# Patient Record
Sex: Male | Born: 1985 | Race: White | Hispanic: No | Marital: Married | Smoking: Former smoker
Health system: Southern US, Community
[De-identification: ages and names within clinical notes are randomized; demographics above are authoritative.]

## PROBLEM LIST (undated history)

## (undated) DIAGNOSIS — F1010 Alcohol abuse, uncomplicated: Secondary | ICD-10-CM

## (undated) DIAGNOSIS — F419 Anxiety disorder, unspecified: Secondary | ICD-10-CM

## (undated) HISTORY — DX: Anxiety disorder, unspecified: F41.9

## (undated) HISTORY — DX: Alcohol abuse, uncomplicated: F10.10

---
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
## 2005-02-13 ENCOUNTER — Emergency Department (HOSPITAL_COMMUNITY): Admission: EM | Admit: 2005-02-13 | Discharge: 2005-02-14 | Payer: Self-pay | Admitting: Emergency Medicine

## 2011-01-27 HISTORY — PX: ANTERIOR CRUCIATE LIGAMENT REPAIR: SHX115

## 2011-06-13 ENCOUNTER — Other Ambulatory Visit (HOSPITAL_COMMUNITY): Payer: Self-pay | Admitting: Orthopedic Surgery

## 2011-06-13 ENCOUNTER — Ambulatory Visit (HOSPITAL_COMMUNITY)
Admission: RE | Admit: 2011-06-13 | Discharge: 2011-06-13 | Disposition: A | Discharge status: Home / Self Care | Payer: BC Managed Care – PPO | Source: Ambulatory Visit | Attending: Orthopedic Surgery | Admitting: Orthopedic Surgery

## 2011-06-13 DIAGNOSIS — Z77018 Contact with and (suspected) exposure to other hazardous metals: Secondary | ICD-10-CM

## 2011-06-13 DIAGNOSIS — Z0389 Encounter for observation for other suspected diseases and conditions ruled out: Secondary | ICD-10-CM | POA: Insufficient documentation

## 2012-12-17 ENCOUNTER — Ambulatory Visit (INDEPENDENT_AMBULATORY_CARE_PROVIDER_SITE_OTHER): Payer: BC Managed Care – PPO | Admitting: Family Medicine

## 2012-12-17 VITALS — BP 140/80 | HR 54 | Temp 97.6°F | Resp 16 | Ht 74.0 in | Wt 186.0 lb

## 2012-12-17 DIAGNOSIS — K5289 Other specified noninfective gastroenteritis and colitis: Secondary | ICD-10-CM

## 2012-12-17 DIAGNOSIS — E860 Dehydration: Secondary | ICD-10-CM

## 2012-12-17 DIAGNOSIS — K529 Noninfective gastroenteritis and colitis, unspecified: Secondary | ICD-10-CM

## 2012-12-17 MED ORDER — DICYCLOMINE HCL 10 MG PO CAPS: 10.0000 mg | ORAL_CAPSULE | Freq: Three times a day (TID) | ORAL | Status: DC

## 2012-12-17 MED ORDER — ONDANSETRON 4 MG PO TBDP: 4.0000 mg | ORAL_TABLET | Freq: Three times a day (TID) | ORAL | Status: DC | PRN

## 2012-12-17 NOTE — Progress Notes (Signed)
Subjective: Since Wednesday the patient been having gastroenteritis symptoms. He started with diarrhea and vomiting. He got himself dry initially, and was lightheaded and fell against a door frame splitting his region of his nose. That is doing better. The vomiting subsided and he has continued having profuse watery diarrhea. He has been able to drink enough to stay ahead fairly well. He is off the new years.  Objective:  He has throat is clear. Neck supple without nodes. Chest clear. Heart regular without murmurs. Abdomen had active bowel sounds. Soft, nontender. He denies any blood in his stool.  Assessment: Acute gastroenteritis with mild dehydration and small laceration on bridge of nose  Plan:  Per his discharge instructions. Mental Zofran Imodium Fluids Return when necessary

## 2012-12-17 NOTE — Patient Instructions (Signed)
Drink lots of fluids  Eat bland foods  Try to get sufficient rest  Take the Bentyl before meals and at bedtime for a day or 2 or until symptoms are really doing well  Use the Zofran only if needed for vomiting  Get over-the-counter Imodium take as directed for diarrhea

## 2016-02-01 ENCOUNTER — Encounter: Payer: Self-pay | Admitting: Family Medicine

## 2016-02-01 ENCOUNTER — Ambulatory Visit (INDEPENDENT_AMBULATORY_CARE_PROVIDER_SITE_OTHER): Payer: Worker's Compensation | Admitting: Family Medicine

## 2016-02-01 VITALS — BP 134/90 | HR 87 | Temp 97.9°F | Ht 74.0 in | Wt 199.6 lb

## 2016-02-01 DIAGNOSIS — T1591XA Foreign body on external eye, part unspecified, right eye, initial encounter: Secondary | ICD-10-CM | POA: Diagnosis not present

## 2016-02-01 MED ORDER — CYCLOSPORINE 0.05 % OP EMUL: 1.0000 [drp] | Freq: Two times a day (BID) | OPHTHALMIC | Status: DC

## 2016-02-01 NOTE — Progress Notes (Signed)
   Subjective:  Patient ID: Jacob Herring, male    DOB: 1986-01-10  Age: 30 y.o. MRN: [id number]  CC: Eye Injury   HPI JOAL EAKLE presents for noted redness in the right eye last night. He thought he had some metal in it. He is a Psychologist, occupational. He has some irritation in the eye today. He states that last night when he looked in the mirror before bed he noted the eye was somewhat red and he started irrigating it. He states he irrigated it for about 30 minutes. At some point during that time he tried to get out what he saw in his eye as a black spot and had a black flat, out on a Q-tip. However the black spot remaining so he continue trying to work on it. This morning he states that his vision is unaffected. However it remains red in the right conjunctiva. There is some irritation only. The left has been unaffected. He says that he has some decreased vision on the right historically compared to the left.    ROS Review of Systems  Constitutional: Negative for fever, chills and diaphoresis.  HENT: Negative for rhinorrhea and sore throat.   Eyes: Positive for redness and itching. Negative for photophobia, discharge and visual disturbance.  Respiratory: Negative for cough and shortness of breath.   Cardiovascular: Negative for chest pain.  Gastrointestinal: Negative for abdominal pain.  Musculoskeletal: Negative for myalgias and arthralgias.  Skin: Negative for rash.  Neurological: Negative for weakness and headaches.    Objective:  BP 134/90 mmHg  Pulse 87  Temp(Src) 97.9 F (36.6 C) (Oral)  Ht  (1.88 m)  Wt 199 lb 9.6 oz (90.538 kg)  BMI 25.62 kg/m2  SpO2 99%  Physical Exam  Constitutional: He is oriented to person, place, and time. He appears well-developed and well-nourished.  HENT:  Head: Normocephalic and atraumatic.  Nose: Nose normal.  Mouth/Throat: No oropharyngeal exudate.  Eyes: EOM and lids are normal. Lids are everted and swept, no foreign bodies found. Right eye  exhibits no chemosis, no discharge, no exudate and no hordeolum. No foreign body present in the right eye. Right conjunctiva is injected. Right conjunctiva has no hemorrhage. No scleral icterus. Right eye exhibits no nystagmus. Right pupil is not reactive. Pupils are equal.  Fundoscopic exam:      The right eye shows no hemorrhage and no papilledema. The right eye shows red reflex. The right eye shows no venous pulsations.  Neck: Normal range of motion. Neck supple.  Neurological: He is alert and oriented to person, place, and time. No cranial nerve deficit.  Skin: Skin is warm and dry.  Psychiatric: He has a normal mood and affect. His behavior is normal. Thought content normal.    Assessment & Plan:   Osmar was seen today for eye injury.  Diagnoses and all orders for this visit:  Foreign body, eye, right, initial encounter  Other orders -     cycloSPORINE (RESTASIS) 0.05 % ophthalmic emulsion; Place 1 drop into the right eye 2 (two) times daily. To moisturize    Return to regular duty today.   Follow-up: Return if symptoms worsen or fail to improve.  Mechele Claude, M.D.

## 2016-02-04 ENCOUNTER — Other Ambulatory Visit: Payer: Self-pay | Admitting: *Deleted

## 2016-02-04 MED ORDER — CYCLOSPORINE 0.05 % OP EMUL: 1.0000 [drp] | Freq: Two times a day (BID) | OPHTHALMIC | Status: DC

## 2016-02-04 NOTE — Progress Notes (Signed)
spoke with Neysa Bonito at [employer] Pt needed Rx for eye drops sent into [facility] sent into pharmacy per pt request

## 2016-02-05 ENCOUNTER — Telehealth: Payer: Self-pay

## 2016-02-05 NOTE — Telephone Encounter (Signed)
Patients company paid for restasis.

## 2016-02-05 NOTE — Telephone Encounter (Signed)
Tell pt. To use OTC Systane.

## 2016-02-05 NOTE — Telephone Encounter (Signed)
Insurance denied Restasis   Must have one of the following Keratoconjunctivitis sicca or Sjogren syndrome   Must have intolerance to one over the counter med used at an optimal dose for 2 weeks

## 2016-04-28 ENCOUNTER — Encounter: Payer: Self-pay | Admitting: Nurse Practitioner

## 2016-04-28 ENCOUNTER — Ambulatory Visit (INDEPENDENT_AMBULATORY_CARE_PROVIDER_SITE_OTHER): Payer: Worker's Compensation | Admitting: Nurse Practitioner

## 2016-04-28 ENCOUNTER — Ambulatory Visit (INDEPENDENT_AMBULATORY_CARE_PROVIDER_SITE_OTHER): Payer: Worker's Compensation

## 2016-04-28 VITALS — BP 125/77 | HR 49 | Temp 97.3°F | Ht 74.0 in | Wt 200.0 lb

## 2016-04-28 DIAGNOSIS — S92322A Displaced fracture of second metatarsal bone, left foot, initial encounter for closed fracture: Secondary | ICD-10-CM

## 2016-04-28 DIAGNOSIS — S99921A Unspecified injury of right foot, initial encounter: Secondary | ICD-10-CM | POA: Diagnosis not present

## 2016-04-28 NOTE — Progress Notes (Signed)
   Subjective:    Patient ID: Jacob Herring, male    DOB: 1986-07-13, 30 y.o.   MRN: [id number]  HPI  DATE OF INJURY 04/07/16   Emp- [employer]  Patient was working on a water tank and a lid fell on his right foot. Painful every since- stays swollen- pain has improved some but it is still hurting.  Review of Systems  Constitutional: Negative.   HENT: Negative.   Respiratory: Negative.   Cardiovascular: Negative.   Genitourinary: Negative.   Neurological: Negative.   Psychiatric/Behavioral: Negative.   All other systems reviewed and are negative.      Objective:   Physical Exam  Constitutional: He is oriented to person, place, and time. He appears well-developed and well-nourished. No distress.  Cardiovascular: Normal rate, regular rhythm and normal heart sounds.   Pulmonary/Chest: Effort normal and breath sounds normal.  Musculoskeletal:  Pian on dorsal surface of right foot along 2nd metatasal. NO edema or pain with movement  Neurological: He is alert and oriented to person, place, and time.  Skin: Skin is warm.  Psychiatric: He has a normal mood and affect. His behavior is normal. Judgment and thought content normal.   BP 125/77 mmHg  Pulse 49  Temp(Src) 97.3 F (36.3 C) (Oral)  Ht 6\' 2"  (1.88 m)  Wt 200 lb (90.719 kg)  BMI 25.67 kg/m2  Right foot xray- displace fracture of 2nd metatarsal-Preliminary reading by Paulene FloorMary Jema Deegan, FNP  [facility]        Assessment & Plan:   1. Foot injury, right, initial encounter   2. Displaced fracture of second metatarsal bone of left foot, closed, initial encounter    Orders Placed This Encounter  Procedures  . DG Foot Complete Right    Standing Status: Future     Number of Occurrences: 1     Standing Expiration Date: 06/28/2017    Order Specific Question:  Reason for Exam (SYMPTOM  OR DIAGNOSIS REQUIRED)    Answer:  foot injury    Order Specific Question:  Preferred imaging location?    Answer:  Internal  . Ambulatory referral  to Orthopedic Surgery    Referral Priority:  Urgent    Referral Type:  Surgical    Referral Reason:  Specialty Services Required    Requested Specialty:  Orthopedic Surgery    Number of Visits Requested:  1   Wll let ortho decide on care  Mary-Margaret Daphine DeutscherMartin, FNP

## 2016-12-19 ENCOUNTER — Encounter: Payer: Self-pay | Admitting: Family Medicine

## 2016-12-19 ENCOUNTER — Ambulatory Visit (INDEPENDENT_AMBULATORY_CARE_PROVIDER_SITE_OTHER): Payer: 59 | Admitting: Family Medicine

## 2016-12-19 VITALS — BP 136/72 | HR 74 | Temp 98.2°F | Ht 75.0 in | Wt 197.2 lb

## 2016-12-19 DIAGNOSIS — F1010 Alcohol abuse, uncomplicated: Secondary | ICD-10-CM

## 2016-12-19 DIAGNOSIS — Z87898 Personal history of other specified conditions: Secondary | ICD-10-CM | POA: Insufficient documentation

## 2016-12-19 DIAGNOSIS — N50811 Right testicular pain: Secondary | ICD-10-CM

## 2016-12-19 NOTE — Progress Notes (Signed)
Phone: [phone]  Subjective:  Patient presents today to establish care. Chief complaint-noted.   See problem oriented charting  The following were reviewed and entered/updated in epic: Past Medical History:  Diagnosis Date  . Alcohol abuse    Patient Active Problem List   Diagnosis Date Noted  . Alcohol abuse    Past Surgical History:  Procedure Laterality Date  . ANTERIOR CRUCIATE LIGAMENT REPAIR Right 01/27/2011    Family History  Problem Relation Age of Onset  . Alcohol abuse Mother   . Drug abuse Mother   . Lung cancer Maternal Grandfather   . Other Father     chronic back pain  . Obesity Father   . Sudden death Cousin     before age 30 PNA leading to sepsis in cousin- tough for patient    Medications- reviewed and updated No current outpatient prescriptions on file.   No current facility-administered medications for this visit.     Allergies-reviewed and updated Allergies  Allergen Reactions  . Penicillins Anaphylaxis    Social History   Social History  . Marital status: Married    Spouse name: N/A  . Number of children: N/A  . Years of education: N/A   Social History Main Topics  . Smoking status: Current Every Day Smoker    Packs/day: 0.50    Years: 6.00    Types: Cigarettes  . Smokeless tobacco: Current User  . Alcohol use Yes  . Drug use: No  . Sexual activity: Yes    Birth control/ protection: None   Other Topics Concern  . None   Social History Narrative   Married. Wife Ayla patient of Dr. Durene CalHunter   [place] and owns home as of 2017      Works as Psychologist, [employer]. HS degree    ROS--Full ROS was completed Review of Systems  Constitutional: Negative for chills and fever.  HENT: Negative for hearing loss and tinnitus.   Eyes: Negative for blurred vision and double vision.  Respiratory: Negative for cough and hemoptysis.   Cardiovascular: Negative for chest pain and palpitations.  Gastrointestinal: Negative for heartburn  and nausea.  Genitourinary: Negative for dysuria, hematuria and urgency.  Musculoskeletal: Negative for myalgias and neck pain.  Skin: Negative for itching and rash.  Neurological: Negative for dizziness.  Endo/Heme/Allergies: Does not bruise/bleed easily.  Psychiatric/Behavioral: Positive for substance abuse (2 12 packs on weekend- advised cessation). Negative for suicidal ideas. The patient is nervous/anxious.    Objective: BP 136/72   Pulse 74   Temp 98.2 F (36.8 C) (Oral)   Ht 6\' 3"  (1.905 m)   Wt 197 lb 3.2 oz (89.4 kg)   SpO2 97%   BMI 24.65 kg/m  Gen: NAD, resting comfortably HEENT: Mucous membranes are moist. Oropharynx normal. TM normal. Eyes: sclera and lids normal, PERRLA Neck: no thyromegaly, no cervical lymphadenopathy CV: RRR no murmurs rubs or gallops Lungs: CTAB no crackles, wheeze, rhonchi Abdomen: soft/nontender/nondistended/normal bowel sounds. No rebound or guarding.  Ext: no edema Skin: warm, dry Neuro: 5/5 strength in upper and lower extremities, normal gait, normal reflexes  GU: normal penis, no hernia noted,2-3 cm bag of worms sensation right side of scrotum, none on left. Both testicles normal size and nontender including epidymis  Assessment/Plan:  Right testicular pain - Plan: US Scrotum S: Right testicular discomfort. Started about 2 years ago when using an ab roller and improved when he stopped. Seen at [employer] physical and thought related to potential strain. At age 30  did have hernia surgery. He is really active doing ab roller, climbing water towers. 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 Does testicular self exams and has not felt bumps or lumps. No pain with palpation. No family history of testicular cancer. States does not think they will plan on having children. No dysuria or polyuria. No penile discharge or rectal pain. Discomfort moderate ache A/P: will arrange for testicular ultrasound at this point though suspect varicocele as cause  (doubt cancer). Counseling on varicocele provided- he states fertility not important for him as does not think wants to get pregnant and discomfort is not super significant- just wanted to make sure nothing more sever egoing on.  Alcohol abuse- advised cessation  Return in about 6 months (around 06/19/2017) for physical.   Orders Placed This Encounter  Procedures  . US Scrotum    Standing Status:   Future    Standing Expiration Date:   02/19/2018    Order Specific Question:   Reason for Exam (SYMPTOM  OR DIAGNOSIS REQUIRED)    Answer:   right testicular pain/fullness- suspect varicocele    Order Specific Question:   Preferred imaging location?    Answer:   [id number]    Return precautions advised.  Tana ConchStephen Leeum Sankey, MD

## 2016-12-19 NOTE — Patient Instructions (Signed)
Advise you to quit smoking completely- great job on cutting back  Advise you to consider AA given your frequent alcohol  We will call you within a week about your referral for testicular ultrasound. If you do not hear within 2 weeks, give us a call.   I suspect this is varicocele though   Varicocele A varicocele is a swelling of veins in the scrotum. The scrotum is the sac that contains the testicles. Varicoceles can occur on either side of the scrotum, but they are more common on the left side. They occur most often in teenage boys and young men. In most cases, varicoceles are not a serious problem. They are usually small and painless and do not require treatment. Tests may be done to confirm the diagnosis. Treatment may be needed if:  A varicocele is large, causes a lot of pain, or causes pain when exercising.  Varicoceles are found on both sides of the scrotum.  The testicle on the opposite side is absent or not normal.  A varicocele causes a decrease in the size of the testicle in a growing adolescent.  The person has fertility problems. What are the causes? This condition is the result of valves in the veins not working properly. Valves in the veins help to return blood from the scrotum and testicles to the heart. If these valves do not work well, blood flows backward and backs up into the veins, which causes the veins to swell. This is similar to what happens when varicose veins form in the leg. What are the signs or symptoms? Most varicoceles do not cause any symptoms. If symptoms do occur, they may include:  Swelling on one side of the scrotum. The swelling may be more obvious when you are standing up.  A lumpy feeling in the scrotum.  A heavy feeling on one side of the scrotum.  A dull ache in the scrotum, especially after exercise or prolonged standing or sitting.  Slower growth or reduced size of the testicle on the side of the varicocele (in young males).  Problems  with fertility. These can occur if the testicle does not grow normally. How is this diagnosed? This condition may be diagnosed with a physical exam. You may also have an imaging test, called an ultrasound, to confirm the diagnosis and to help rule out other causes of the swelling. How is this treated? Treatment is usually not needed for this condition. If you have any pain, your health care provider may prescribe or recommend medicine to help relieve it. You may need regular exams so your health care provider can monitor the varicocele to ensure that it does not cause problems. When further treatment is needed, it may involve one of these options:  Varicocelectomy. This is a surgery in which the swollen veins are tied off so that the flow of blood goes to other veins instead.  Embolization. In this procedure, a small tube (catheter) is used to place metal coils or other blocking items in the veins. This cuts off the blood flow to the swollen veins. Follow these instructions at home:  Take medicines only as directed by your health care provider.  Wear supportive underwear.  Use an athletic supporter for sports.  Keep all follow-up visits as directed by your health care provider. This is important. Contact a health care provider if:  Your pain is increasing.  You have redness in the affected area.  You have swelling that does not decrease when you are lying  down.  One of your testicles is smaller than the other.  Your testicle becomes enlarged, swollen, or painful. This information is not intended to replace advice given to you by your health care provider. Make sure you discuss any questions you have with your health care provider. Document Released: 03/23/2001 Document Revised: 05/28/2016 Document Reviewed: 11/22/2014 Elsevier Interactive Patient Education  2017 [employer]

## 2016-12-19 NOTE — Progress Notes (Signed)
Pre visit review using our clinic review tool, if applicable. No additional management support is needed unless otherwise documented below in the visit note. 

## 2016-12-23 ENCOUNTER — Other Ambulatory Visit: Payer: Self-pay | Admitting: Family Medicine

## 2016-12-23 ENCOUNTER — Telehealth: Payer: Self-pay | Admitting: Family Medicine

## 2016-12-23 NOTE — Telephone Encounter (Addendum)
Jacob MessierKathy with [facility] needs another order added to the one in the system dopplar code 2191  She is aware dr hunter is out until next week.

## 2016-12-24 ENCOUNTER — Other Ambulatory Visit: Payer: Self-pay

## 2016-12-24 DIAGNOSIS — N50811 Right testicular pain: Secondary | ICD-10-CM

## 2016-12-24 NOTE — Telephone Encounter (Signed)
Order entered as requested

## 2017-01-02 ENCOUNTER — Ambulatory Visit
Admission: RE | Admit: 2017-01-02 | Discharge: 2017-01-02 | Disposition: A | Discharge status: Home / Self Care | Payer: 59 | Source: Ambulatory Visit | Attending: Family Medicine | Admitting: Family Medicine

## 2017-01-02 DIAGNOSIS — N50811 Right testicular pain: Secondary | ICD-10-CM

## 2017-01-06 ENCOUNTER — Other Ambulatory Visit: Payer: Self-pay | Admitting: Family Medicine

## 2017-01-06 DIAGNOSIS — N50811 Right testicular pain: Secondary | ICD-10-CM

## 2017-01-12 ENCOUNTER — Other Ambulatory Visit: Payer: 59

## 2017-01-12 ENCOUNTER — Other Ambulatory Visit (HOSPITAL_COMMUNITY)
Admission: RE | Admit: 2017-01-12 | Discharge: 2017-01-12 | Disposition: A | Discharge status: Home / Self Care | Payer: 59 | Source: Ambulatory Visit | Attending: Family Medicine | Admitting: Family Medicine

## 2017-01-12 DIAGNOSIS — N50811 Right testicular pain: Secondary | ICD-10-CM

## 2017-01-12 DIAGNOSIS — Z113 Encounter for screening for infections with a predominantly sexual mode of transmission: Secondary | ICD-10-CM | POA: Diagnosis not present

## 2017-01-13 LAB — URINE CYTOLOGY ANCILLARY ONLY
Chlamydia: NEGATIVE
Neisseria Gonorrhea: NEGATIVE
TRICH (WINDOWPATH): NEGATIVE

## 2017-01-19 ENCOUNTER — Ambulatory Visit: Payer: 59 | Admitting: Family Medicine

## 2017-01-26 ENCOUNTER — Encounter: Payer: Self-pay | Admitting: Family Medicine

## 2017-01-26 ENCOUNTER — Ambulatory Visit (INDEPENDENT_AMBULATORY_CARE_PROVIDER_SITE_OTHER): Payer: 59 | Admitting: Family Medicine

## 2017-01-26 VITALS — BP 128/78 | HR 69 | Temp 98.6°F | Ht 75.0 in | Wt 205.2 lb

## 2017-01-26 DIAGNOSIS — Z23 Encounter for immunization: Secondary | ICD-10-CM

## 2017-01-26 DIAGNOSIS — N451 Epididymitis: Secondary | ICD-10-CM | POA: Diagnosis not present

## 2017-01-26 DIAGNOSIS — Z8371 Family history of colonic polyps: Secondary | ICD-10-CM | POA: Insufficient documentation

## 2017-01-26 MED ORDER — LEVOFLOXACIN 500 MG PO TABS: 500.0000 mg | ORAL_TABLET | Freq: Every day | ORAL | 0 refills | Status: DC

## 2017-01-26 NOTE — Progress Notes (Signed)
Pre visit review using our clinic review tool, if applicable. No additional management support is needed unless otherwise documented below in the visit note. 

## 2017-01-26 NOTE — Patient Instructions (Signed)
On ultrasound looked like a small infection on top of testicle. Let's see if this antibiotic can clear it up. If not, could refer to urology for their opinion. I am concerned this could be muscle strain related as well though given worse after exercise. Could consider our sports medicine doctors if urology unable to help   Epididymitis Introduction Epididymitis is swelling (inflammation) of the epididymis. The epididymis is a cord-like structure that is located along the top and back part of the testicle. It collects and stores sperm from the testicle. This condition can also cause pain and swelling of the testicle and scrotum. Symptoms usually start suddenly (acute epididymitis). Sometimes epididymitis starts gradually and lasts for a while (chronic epididymitis). This type may be harder to treat. What are the causes? In men 70 and younger, this condition is usually caused by a bacterial infection or sexually transmitted disease (STD), such as:  Gonorrhea.  Chlamydia. In men 76 and older who do not have anal sex, this condition is usually caused by bacteria from a blockage or abnormalities in the urinary system. These can result from:  Having a tube placed into the bladder (urinary catheter).  Having an enlarged or inflamed prostate gland.  Having recent urinary tract surgery. In men who have a condition that weakens the body's defense system (immune system), such as HIV, this condition can be caused by:  Other bacteria, including tuberculosis and syphilis.  Viruses.  Fungi. Sometimes this condition occurs without infection. That may happen if urine flows backward into the epididymis after heavy lifting or straining. What increases the risk? This condition is more likely to develop in men:  Who have unprotected sex with more than one partner.  Who have anal sex.  Who have recently had surgery.  Who have a urinary catheter.  Who have urinary problems.  Who have a suppressed  immune system. What are the signs or symptoms? This condition usually begins suddenly with chills, fever, and pain behind the scrotum and in the testicle. Other symptoms include:  Swelling of the scrotum, testicle, or both.  Pain whenejaculatingor urinating.  Pain in the back or belly.  Nausea.  Itching and discharge from the penis.  Frequent need to pass urine.  Redness and tenderness of the scrotum. How is this diagnosed? Your health care provider can diagnose this condition based on your symptoms and medical history. Your health care provider will also do a physical exam to ask about your symptoms and check your scrotum and testicle for swelling, pain, and redness. You may also have other tests, including:  Examination of discharge from the penis.  Urine tests for infections, such as STDs. Your health care provider may test you for other STDs, including HIV. How is this treated? Treatment for this condition depends on the cause. If your condition is caused by a bacterial infection, oral antibiotic medicine may be prescribed. If the bacterial infection has spread to your blood, you may need to receive IV antibiotics. Nonbacterial epididymitis is treated with home care that includes bed rest and elevation of the scrotum. Surgery may be needed to treat:  Bacterial epididymitis that causes pus to build up in the scrotum (abscess).  Chronic epididymitis that has not responded to other treatments. Follow these instructions at home: Medicines  Take over-the-counter and prescription medicines only as told by your health care provider.  If you were prescribed an antibiotic medicine, take it as told by your health care provider. Do not stop taking the antibiotic even  if your condition improves. Sexual Activity  If your epididymitis was caused by an STD, avoid sexual activity until your treatment is complete.  Inform your sexual partner or partners if you test positive for an STD.  They may need to be treated.Do not engage in sexual activity with your partner or partners until their treatment is completed. General instructions  Return to your normal activities as told by your health care provider. Ask your health care provider what activities are safe for you.  Keep your scrotum elevated and supported while resting. Ask your health care provider if you should wear a scrotal support, such as a jockstrap. Wear it as told by your health care provider.  If directed, apply ice to the affected area:  Put ice in a plastic bag.  Place a towel between your skin and the bag.  Leave the ice on for 20 minutes, 2-3 times per day.  Try taking a sitz bath to help with discomfort. This is a warm water bath that is taken while you are sitting down. The water should only come up to your hips and should cover your buttocks. Do this 3-4 times per day or as told by your health care provider.  Keep all follow-up visits as told by your health care provider. This is important. Contact a health care provider if:  You have a fever.  Your pain medicine is not helping.  Your pain is getting worse.  Your symptoms do not improve within three days. This information is not intended to replace advice given to you by your health care provider. Make sure you discuss any questions you have with your health care provider. Document Released: 12/12/2000 Document Revised: 05/22/2016 Document Reviewed: 05/02/2015  2017 Elsevier  Levofloxacin tablets What is this medicine? LEVOFLOXACIN (lee voe FLOX a sin) is a quinolone antibiotic. It is used to treat certain kinds of bacterial infections. It will not work for colds, flu, or other viral infections. This medicine may be used for other purposes; ask your health care provider or pharmacist if you have questions. COMMON BRAND NAME(S): Levaquin, Levaquin Leva-Pak What should I tell my health care provider before I take this medicine? They need to  know if you have any of these conditions: -bone problems -diabetes -history of low levels of potassium in the blood -irregular heartbeat -joint problems -kidney disease -liver disease -myasthenia gravis -seizures -tendon problems -tingling of the fingers or toes, or other nerve disorder -an unusual or allergic reaction to levofloxacin, other quinolone antibiotics, foods, dyes, or preservatives -pregnant or trying to get pregnant -breast-feeding How should I use this medicine? Take this medicine by mouth with a full glass of water. Follow the directions on the prescription label. This medicine can be taken with or without food. Take your medicine at regular intervals. Do not take your medicine more often than directed. Do not skip doses or stop your medicine early even if you feel better. Do not stop taking except on your doctor's advice. A special MedGuide will be given to you by the pharmacist with each prescription and refill. Be sure to read this information carefully each time. Talk to your pediatrician regarding the use of this medicine in children. While this drug may be prescribed for children as young as 6 months for selected conditions, precautions do apply. Overdosage: If you think you have taken too much of this medicine contact a poison control center or emergency room at once. NOTE: This medicine is only for you. Do not  share this medicine with others. What if I miss a dose? If you miss a dose, take it as soon as you remember. If it is almost time for your next dose, take only that dose. Do not take double or extra doses. What may interact with this medicine? Do not take this medicine with any of the following medications: -bepridil -certain medicines for depression, anxiety, or psychotic disturbances like pimozide, thioridazine, and ziprasidone -certain medicines for irregular heart beat like dofetilide and dronedarone -cisapride -halofantrine This medicine may also  interact with the following medications: -antacids -birth control pills -certain medicines for diabetes, like glipizide, glyburide, or insulin -didanosine buffered tablets or powder -multivitamins -NSAIDS, medicines for pain and inflammation, like ibuprofen or naproxen -steroid medicines like prednisone or cortisone -sucralfate -theophylline -warfarin This list may not describe all possible interactions. Give your health care provider a list of all the medicines, herbs, non-prescription drugs, or dietary supplements you use. Also tell them if you smoke, drink alcohol, or use illegal drugs. Some items may interact with your medicine. What should I watch for while using this medicine? Tell your doctor or healthcare professional if your symptoms do not start to get better or if they get worse. Do not treat diarrhea with over the counter products. Contact your doctor if you have diarrhea that lasts more than 2 days or if it is severe and watery. Check with your doctor or health care professional if you get an attack of severe diarrhea, nausea and vomiting, or if you sweat a lot. The loss of too much body fluid can make it dangerous for you to take this medicine. You may get drowsy or dizzy. Do not drive, use machinery, or do anything that needs mental alertness until you know how this medicine affects you. Do not sit or stand up quickly, especially if you are an older patient. This reduces the risk of dizzy or fainting spells. This medicine can make you more sensitive to the sun. Keep out of the sun. If you cannot avoid being in the sun, wear protective clothing and use a sunscreen. Do not use sun lamps or tanning beds/booths. Contact your doctor if you get a sunburn. If you are a diabetic monitor your blood glucose carefully. If you get an unusual reading stop taking this medicine and call your doctor right away. Avoid antacids, calcium, iron, and zinc products for 2 hours before and 2 hours after  taking a dose of this medicine. What side effects may I notice from receiving this medicine? Side effects that you should report to your doctor or health care professional as soon as possible: -allergic reactions like skin rash or hives, swelling of the face, lips, or tongue -anxious -breathing problems -confusion -depressed mood -diarrhea -dizziness -fast, irregular heartbeat -hallucination, loss of contact with reality -joint, muscle, or tendon pain or swelling -muscle weakness -pain, tingling, numbness in the hands or feet -seizures -signs and symptoms of high blood sugar such as dizziness; dry mouth; dry skin; fruity breath; nausea; stomach pain; increased hunger or thirst; increased urination -signs and symptoms of liver injury like dark yellow or brown urine; general ill feeling or flu-like symptoms; light-colored stools; loss of appetite; nausea; right upper belly pain; unusually weak or tired; yellowing of the eyes or skin -signs and symptoms of low blood sugar such as feeling anxious; confusion; dizziness; increased hunger; unusually weak or tired; sweating; shakiness; cold; irritable; headache; blurred vision; fast heartbeat; loss of consciousness -suicidal thoughts or other mood changes -sunburn -  unusually weak or tired Side effects that usually do not require medical attention (report to your doctor or health care professional if they continue or are bothersome): -constipation -dry mouth -headache -nausea, vomiting -trouble sleeping This list may not describe all possible side effects. Call your doctor for medical advice about side effects. You may report side effects to FDA at 1-800-FDA-1088. Where should I keep my medicine? Keep out of the reach of children. Store at room temperature between 15 and 30 degrees C (59 and 86 degrees F). Keep in a tightly closed container. Throw away any unused medicine after the expiration date. NOTE: This sheet is a summary. It may not  cover all possible information. If you have questions about this medicine, talk to your doctor, pharmacist, or health care provider.  2017 Elsevier/Gold Standard (2016-06-24 12:38:27)

## 2017-01-26 NOTE — Progress Notes (Signed)
Subjective:  Jacob Herring is a 31 y.o. year old very pleasant male patient who presents for/with See problem oriented charting  ROS-  no fever, chills. No dysuria or penile discharge. No left testicular pain Past Medical History-  Patient Active Problem List   Diagnosis Date Noted  . Family history of colonic polyps 01/26/2017  . Alcohol abuse     Medications- reviewed and updated, none  Objective: BP 128/78 (BP Location: Left Arm, Patient Position: Sitting, Cuff Size: Large)   Pulse 69   Temp 98.6 F (37 C) (Oral)   Ht 6\' 3"  (1.905 m)   Wt 205 lb 3.2 oz (93.1 kg)   SpO2 98%   BMI 25.65 kg/m  Gen: NAD, resting comfortably CV: RRR no murmurs rubs or gallops Lungs: CTAB no crackles, wheeze, rhonchi Abdomen: soft/nontender/nondistended/normal bowel sounds. No rebound or guarding. GU: patient does have some pain on top of his testicle without nodules. scrotal contents otherwise normal/nontender  Ext: no edema Skin: warm, dry, no rash   Assessment/Plan:  Epididymitis S: right testicular discomfort about 2 years now. Usually worse after using ab roller or if sits in car a long time. Seen by [employer] for CPE and thought potential strain- hernia surgery at age 89 but no hernia detected on exam. He really enjoys exercising but this discomfort makes him not want to as much. 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   We did an ultrasound last time and from 01/02/17 "IMPRESSION: No testicular abnormality.  Thickened appearance of the tail of the right epididymis, question focal area of epididymitis.  Small bilateral hydroceles."  We did STD testing just to be sure given age though monogamous with wife and negative for gonorrhea/chlamydia/trichomonas.  A/P: Given above workup- we were concerned about potential low level epididymitis. We opted to treat with levaquin given low risk for STD and recent negative testing. Discussed if not improving with above  consider urology referral and if no cause found there consider sports medicine for msk component.   Orders Placed This Encounter  Procedures  . Tdap vaccine greater than or equal to 7yo IM   Meds ordered this encounter  Medications  . levofloxacin (LEVAQUIN) 500 MG tablet    Sig: Take 1 tablet (500 mg total) by mouth daily.    Dispense:  10 tablet    Refill:  0   Return precautions advised.  Tana ConchStephen Adamaris King, MD

## 2017-03-13 ENCOUNTER — Ambulatory Visit (INDEPENDENT_AMBULATORY_CARE_PROVIDER_SITE_OTHER): Payer: 59 | Admitting: Family Medicine

## 2017-03-13 ENCOUNTER — Encounter: Payer: Self-pay | Admitting: Family Medicine

## 2017-03-13 VITALS — BP 130/62 | HR 80 | Temp 99.0°F | Ht 75.0 in | Wt 206.2 lb

## 2017-03-13 DIAGNOSIS — N50811 Right testicular pain: Secondary | ICD-10-CM | POA: Diagnosis not present

## 2017-03-13 NOTE — Patient Instructions (Signed)
We will call you within a week or two about your referral to urology for right testicular pain. If you do not hear within 3-4 weeks, give [place] call or send mychart message

## 2017-03-13 NOTE — Progress Notes (Signed)
Pre visit review using our clinic review tool, if applicable. No additional management support is needed unless otherwise documented below in the visit note. 

## 2017-03-13 NOTE — Progress Notes (Signed)
Subjective:  Jacob Herring is a 31 y.o. year old very pleasant male patient who presents for/with See problem oriented charting  ROS- once again no issues with fever, chills, dysuria, polyuria, rectal pain, penile discharge.   Past Medical History-  Patient Active Problem List   Diagnosis Date Noted  . Family history of colonic polyps 01/26/2017  . Alcohol abuse    Medications- reviewed and updated, none. Finished levaquin course  Objective: BP 130/62 (BP Location: Left Arm, Patient Position: Sitting, Cuff Size: Large)   Pulse 80   Temp 99 F (37.2 C) (Oral)   Ht 6\' 3"  (1.905 m)   Wt 206 lb 3.2 oz (93.5 kg)   SpO2 98%   BMI 25.77 kg/m  Gen: NAD, resting comfortably CV: regular rate Lungs: nonlabored GU: declines exam today including rectal as prefers to do with urology   Assessment/Plan:  Right testicular pain. Initially thought to be Epididymitis potentially S: Year or two ago happened for month or two- pain in right testicle. Stopped ab roller and within a month or two resolved completley. Then about 4-5 months ago restarted. He has stopped the ab roller exercise but persists. Intermittently gets pain often after sitting for a while.   From prior notes " Usually worse after using ab roller or if sits in car a long time. Seen by [employer] for CPE and thought potential strain- hernia surgery at age 89 but no hernia detected on exam. He really enjoys exercising but this discomfort makes him not want to as much. 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   We did an ultrasound last time and from 01/02/17 'IMPRESSION: No testicular abnormality.  Thickened appearance of the tail of the right epididymis, question focal area of epididymitis.  Small bilateral hydroceles.'  We did STD testing just to be sure given age though monogamous with wife and negative for gonorrhea/chlamydia/trichomonas. "  At last visit we treated him for potential low level  epididymitis with 10 days of levaquin.   Today, he states he had about 25% improvement after treatment but has not been doing his ab roller exercises either. Happens intermittently for most part and after sitting for prolonged period.   A/P: Right testicular pain for last 4-5 months after prior similar episode 2 years ago for only 1-2 months. Did not respond to 10 days of levaquin for potential epididymitis. We opted for urology referral at this point given persistence. Could also be MSK. Had not detected hernia previously. he Declined rectal as going to see urology- we discussed possible prostatitis but thought unlikely  Orders Placed This Encounter  Procedures  . Ambulatory referral to Urology    Referral Priority:   Routine    Referral Type:   Consultation    Referral Reason:   Specialty Services Required    Requested Specialty:   Urology    Number of Visits Requested:   1   Return precautions advised.  Tana ConchStephen Kashton Mcartor, MD

## 2018-04-08 ENCOUNTER — Ambulatory Visit: Payer: Self-pay | Admitting: *Deleted

## 2018-04-08 NOTE — Telephone Encounter (Signed)
Pt having complaints of solid stools with blood mixed in since Tuesday morning.Pt states he does not see a lot of blood mixed in with stools. Pt states he has not experienced any constipation and is not currently having diarrhea with episodes of stools. Pt states he had bowel movements 3 times today and noticed mucus like bright red blood was on top of the stool and mixed in the stool.Pt denies any abdominal pain, dizziness or other symptoms. Appt scheduled for 4/12.Pt advised to seek treatment in the ED if symptoms worsen or new symptoms develop before seen for appt.Pt verbalized understanding .Reason for Disposition . MILD rectal bleeding (more than just a few drops or streaks)  Answer Assessment - Initial Assessment Questions 1. APPEARANCE of BLOOD: "What color is it?" "Is it passed separately, on the surface of the stool, or mixed in with the stool?"      Solid stools with blood mixed in  2. AMOUNT: "How much blood was passed?"      Not a lot just on top of stool, looks like mucus 3. FREQUENCY: "How many times has blood been passed with the stools?"      3 times today 4. ONSET: "When was the blood first seen in the stools?" (Days or weeks)      Tuesday morning 5. DIARRHEA: "Is there also some diarrhea?" If so, ask: "How many diarrhea stools were passed in past 24 hours?"      No 6. CONSTIPATION: "Do you have constipation?" If so, "How bad is it?"     No 7. RECURRENT SYMPTOMS: "Have you had blood in your stools before?" If so, ask: "When was the last time?" and "What happened that time?"      No 8. BLOOD THINNERS: "Do you take any blood thinners?" (e.g., Coumadin/warfarin, Pradaxa/dabigatran, aspirin)     No 9. OTHER SYMPTOMS: "Do you have any other symptoms?"  (e.g., abdominal pain, vomiting, dizziness, fever)     No  Protocols used: RECTAL BLEEDING-A-AH

## 2018-04-09 ENCOUNTER — Encounter: Payer: Self-pay | Admitting: Family Medicine

## 2018-04-09 ENCOUNTER — Encounter: Payer: Self-pay | Admitting: Gastroenterology

## 2018-04-09 ENCOUNTER — Ambulatory Visit (INDEPENDENT_AMBULATORY_CARE_PROVIDER_SITE_OTHER): Payer: 59 | Admitting: Family Medicine

## 2018-04-09 VITALS — BP 112/78 | HR 78 | Temp 98.3°F | Ht 75.0 in | Wt 201.4 lb

## 2018-04-09 DIAGNOSIS — K921 Melena: Secondary | ICD-10-CM | POA: Diagnosis not present

## 2018-04-09 LAB — POC HEMOCCULT BLD/STL (OFFICE/1-CARD/DIAGNOSTIC): FECAL OCCULT BLD: POSITIVE — AB

## 2018-04-09 NOTE — Progress Notes (Signed)
Subjective:  Jacob Herring is a 32 y.o. year old very pleasant male patient who presents for/with See problem oriented charting ROS- no chest pain, fatigue, shortness of breath, dizziness   Past Medical History-  Patient Active Problem List   Diagnosis Date Noted  . Family history of colonic polyps 01/26/2017  . Alcohol abuse     Medications- reviewed and updated No current outpatient medications on file.   No current facility-administered medications for this visit.     Objective: BP 112/78 (BP Location: Left Arm, Patient Position: Sitting, Cuff Size: Large)   Pulse 78   Temp 98.3 F (36.8 C) (Oral)   Ht 6\' 3"  (1.905 m)   Wt 201 lb 6.4 oz (91.4 kg)   SpO2 98%   BMI 25.17 kg/m  Gen: NAD, resting comfortably CV: RRR no murmurs rubs or gallops Lungs: CTAB no crackles, wheeze, rhonchi Abdomen: soft/nontender/nondistended/normal bowel sounds. Ext: no edema Skin: warm, dry Rectal: no obvious hemorrhoids or fissures. Internal exam- no obvious internal hemorrhoids and no pain. No anoscopy done.   Results for orders placed or performed in visit on 04/09/18 (from the past 24 hour(s))  POC Hemoccult Bld/Stl (1-Cd Office Dx)     Status: Abnormal   Collection Time: 04/09/18 10:40 AM  Result Value Ref Range   Card #1 Date 04/09/2018    Fecal Occult Blood, POC Positive (A) Negative   Assessment/Plan:  Bloody stools - Plan: POC Hemoccult Bld/Stl (1-Cd Office Dx), Ambulatory referral to Gastroenterology S:  Tuesday morning patient noted solid stool then pink twinge on toilet paper. After that noted small streak of blood on bowel movements over 2-3 days usually twice a day. Seemed like small volume. Thought perhaps light mucus. No abdominal pain, chest pain, dizziness, chest pain, abnormal fatigue. No recent diarrhea or constipation. He does not take aspirin regularly or blood thinners. He did take bayer aspirin Monday morning though.  A/P: 32 year old with rectal bleeding over last 3  days- resolved today but still hemocult positive. Possibly related to aspirin use. No obvious cause on exams. Father had precancerous polyps with first colonoscopy so assume adenoma. We will refer to GI for their opinion- I would like their opinion on early colonoscopy in light of family history.   Lab/Order associations: Bloody stools - Plan: POC Hemoccult Bld/Stl (1-Cd Office Dx), Ambulatory referral to Gastroenterology  Return precautions advised.  Tana ConchStephen Hunter, MD

## 2018-04-09 NOTE — Patient Instructions (Signed)
We will call you within a week or two about your referral to GI. If you do not hear within 3 weeks, give us a call. May take a few months to get in- possibly sooner  If you have recurrent bloody stool over the weekend please update me or if happens again. Would stay away from the aspirin until evaluation

## 2018-04-15 ENCOUNTER — Other Ambulatory Visit (INDEPENDENT_AMBULATORY_CARE_PROVIDER_SITE_OTHER): Payer: 59

## 2018-04-15 ENCOUNTER — Telehealth: Payer: Self-pay | Admitting: Family Medicine

## 2018-04-15 ENCOUNTER — Other Ambulatory Visit: Payer: Self-pay

## 2018-04-15 DIAGNOSIS — K921 Melena: Secondary | ICD-10-CM

## 2018-04-15 LAB — CBC AND DIFFERENTIAL
HCT: 44 (ref 41–53)
Hemoglobin: 14.9 (ref 13.5–17.5)
PLATELETS: 222 (ref 150–399)
WBC: 5

## 2018-04-15 NOTE — Addendum Note (Signed)
Addended by: Felix AhmadiFRANSEN, Jerianne Anselmo A on: 04/15/2018 09:26 AM   Modules accepted: Orders

## 2018-04-15 NOTE — Telephone Encounter (Signed)
Copied from CRM [phone]. Topic: Quick Communication - See Telephone Encounter >> Apr 15, 2018  8:03 AM Cipriano BunkerLambe, Annette S wrote: CRM for notification.   Pt. Was to let Dr. Durene CalHunter know if he still is having bloody stool and he is.  He could not get in to have a colonoscopy until 5/24  Lab appt. Today at 9:30  Had bloody stool Twice a day yesterday and today. Bright Red; he said he was on the toilet and nothing would come out but when he wiped it was bright red.  Good Amount (not a little dab)   See Telephone encounter for: 04/15/18.

## 2018-04-15 NOTE — Telephone Encounter (Signed)
See note

## 2018-04-16 LAB — CBC
Hematocrit: 44.1 % (ref 37.5–51.0)
Hemoglobin: 14.9 g/dL (ref 13.0–17.7)
MCH: 32 pg (ref 26.6–33.0)
MCHC: 33.8 g/dL (ref 31.5–35.7)
MCV: 95 fL (ref 79–97)
Platelets: 222 10*3/uL (ref 150–379)
RBC: 4.65 x10E6/uL (ref 4.14–5.80)
RDW: 13.1 % (ref 12.3–15.4)
WBC: 5 10*3/uL (ref 3.4–10.8)

## 2018-04-19 ENCOUNTER — Telehealth: Payer: Self-pay | Admitting: Family Medicine

## 2018-04-19 ENCOUNTER — Telehealth: Payer: Self-pay

## 2018-04-19 ENCOUNTER — Other Ambulatory Visit: Payer: Self-pay

## 2018-04-19 ENCOUNTER — Encounter: Payer: Self-pay | Admitting: Family Medicine

## 2018-04-19 DIAGNOSIS — K921 Melena: Secondary | ICD-10-CM

## 2018-04-19 LAB — RBC
MCH: 32
MCHC: 33.8
MCV: 95
RBC: 4.65
RDW: 13.1

## 2018-04-19 NOTE — Telephone Encounter (Signed)
Copied from CRM [phone]. Topic: Quick Communication - Lab Results >> Apr 19, 2018  8:54 AM Everardo PacificMoton, Jacob Herring, NT wrote: Patient calling to check the status of his lab work. If someone could give him a call back at [phone]. If there is no answer please give his wife Eleanora Neighboryla Mccloud a call at [phone]

## 2018-04-19 NOTE — Telephone Encounter (Signed)
Called and spoke to patient to give him his lab results. Patient Verbalized understanding. Patient needed to be seen this week but the earliest I could get him in for a follow up was Tuesday April 30th, at 1:45. Patient has been scheduled.

## 2018-04-19 NOTE — Telephone Encounter (Signed)
Patient came in for a lab appointment April 18th, I gave him his lab results this morning and scheduled him for a follow up. The earliest we had available was April 30th @ 1:45pm for a repeat of cbc

## 2018-04-19 NOTE — Telephone Encounter (Signed)
See note

## 2018-04-19 NOTE — Telephone Encounter (Signed)
Called and spoke to patient to give him his lab results. Patient Verbalized understanding. Patient needed to be seen this week but the earliest I could get him in for a follow up was Tuesday April 30th, at 1:45. Patient has been scheduled. 

## 2018-04-27 ENCOUNTER — Encounter: Payer: Self-pay | Admitting: Family Medicine

## 2018-04-27 ENCOUNTER — Ambulatory Visit (INDEPENDENT_AMBULATORY_CARE_PROVIDER_SITE_OTHER): Payer: 59 | Admitting: Family Medicine

## 2018-04-27 VITALS — BP 118/70 | HR 60 | Temp 99.3°F | Ht 75.0 in | Wt 199.8 lb

## 2018-04-27 DIAGNOSIS — K921 Melena: Secondary | ICD-10-CM

## 2018-04-27 NOTE — Patient Instructions (Signed)
I would try a stool softener like colace/docusate- can use one to two times a day- perhaps just start out with one over the counter.   Please stop by lab before you go

## 2018-04-27 NOTE — Progress Notes (Signed)
Subjective:  Jacob Herring is a 32 y.o. year old very pleasant male patient who presents for/with See problem oriented charting ROS-denies chest pain, fatigue, shortness of breath, dizziness or melena.  Endorses bright red blood per rectum though less frequently in recent days.  Past Medical History-  Patient Active Problem List   Diagnosis Date Noted  . Family history of colonic polyps 01/26/2017  . Alcohol abuse     Medications- reviewed and updated No current outpatient medications on file.   No current facility-administered medications for this visit.     Objective: BP 118/70 (BP Location: Left Arm, Patient Position: Sitting, Cuff Size: Large)   Pulse 60   Temp 99.3 F (37.4 C) (Oral)   Ht  (1.905 m)   Wt 199 lb 12.8 oz (90.6 kg)   SpO2 98%   BMI 24.97 kg/m  Gen: NAD, resting comfortably, well-appearing, muscular build CV: RRR no murmurs rubs or gallops Lungs: CTAB no crackles, wheeze, rhonchi Abdomen: soft/nontender/nondistended/normal bowel sounds.  Ext: no edema Skin: warm, dry Neuro: Normal gait and speech  Assessment/Plan:  Bloody stools - Plan: CBC, Comprehensive metabolic panel S: Since I saw him last has had at least 7- 8 days of bleeding- intermittent issue. Had one week where he sat on toilet straining for 3 days- felt like had to have BM- pushed but nothing- toilet tissue covered in blood after this-he did not have stools at that time.  Has not had any pain with bowel movements during this time.  Has noted some  firm bowel movements.  He feels that things have improved with eating softer foods and stools being softer- no blood with this over the last 4 days until had light amount this morning.   Patient has an appointment with GI in late May.  He specifically requested a visit on a Friday and that is why this visit got pushed back so late.  He is concerned with his continued rectal bleeding A/P: We will update a CBC.  Wife works at [employer] and request a  CMP as well.  He is asymptomatic in regards to anemia.  I do not suspect he has had significant blood loss.  I do suspect an internal hemorrhoid.  With that being said does have a family history of colon polyps- would still like GIs opinion on early colonoscopy.  I offered anoscopy today and patient declined-would prefer to have this with GI if needed as well  Future Appointments  Date Time Provider Department Center  05/21/2018 11:00 AM Danis, Andreas Blower, MD [facility]   Return precautions advised.  Tana Conch, MD

## 2018-04-28 LAB — CBC
Hematocrit: 45.6 % (ref 37.5–51.0)
Hemoglobin: 15.4 g/dL (ref 13.0–17.7)
MCH: 31.8 pg (ref 26.6–33.0)
MCHC: 33.8 g/dL (ref 31.5–35.7)
MCV: 94 fL (ref 79–97)
PLATELETS: 233 10*3/uL (ref 150–379)
RBC: 4.85 x10E6/uL (ref 4.14–5.80)
RDW: 13.4 % (ref 12.3–15.4)
WBC: 5.7 10*3/uL (ref 3.4–10.8)

## 2018-04-28 LAB — COMPREHENSIVE METABOLIC PANEL
A/G RATIO: 1.7 (ref 1.2–2.2)
ALT: 16 IU/L (ref 0–44)
AST: 21 IU/L (ref 0–40)
Albumin: 4.4 g/dL (ref 3.5–5.5)
Alkaline Phosphatase: 77 IU/L (ref 39–117)
BUN/Creatinine Ratio: 7 — ABNORMAL LOW (ref 9–20)
BUN: 6 mg/dL (ref 6–20)
Bilirubin Total: 0.4 mg/dL (ref 0.0–1.2)
CALCIUM: 10.2 mg/dL (ref 8.7–10.2)
CO2: 25 mmol/L (ref 20–29)
CREATININE: 0.85 mg/dL (ref 0.76–1.27)
Chloride: 102 mmol/L (ref 96–106)
GFR calc Af Amer: 132 mL/min/{1.73_m2} (ref >59)
GFR, EST NON AFRICAN AMERICAN: 114 mL/min/{1.73_m2} (ref >59)
Globulin, Total: 2.6 g/dL (ref 1.5–4.5)
Glucose: 90 mg/dL (ref 65–99)
POTASSIUM: 4.9 mmol/L (ref 3.5–5.2)
Sodium: 143 mmol/L (ref 134–144)
TOTAL PROTEIN: 7 g/dL (ref 6.0–8.5)

## 2018-04-29 ENCOUNTER — Telehealth: Payer: Self-pay | Admitting: Family Medicine

## 2018-04-29 ENCOUNTER — Telehealth: Payer: Self-pay

## 2018-04-29 NOTE — Telephone Encounter (Signed)
Copied from CRM [id number]. Topic: Quick Communication - Lab Results >> Apr 29, 2018  8:02 AM Jacob Herring, Rosey Bath D wrote: Patient called and would like his lab results given to him, please call patient back, thanks.

## 2018-04-29 NOTE — Telephone Encounter (Signed)
Copied from CRM #[id number]. Topic: General - Other >> Apr 29, 2018  2:24 PM Debroah Loop wrote: Reason for CRM: Norva Karvonen, with [employer] calling to check if the denial faxed on 04/25 was received or not? Please call back to confirm. They wants this to be available when he comes in for his next appt.

## 2018-04-29 NOTE — Telephone Encounter (Signed)
Called patient and gave his lab results. No follow up questions. Patient verbalized understanding.

## 2018-04-29 NOTE — Telephone Encounter (Signed)
Patient was called and received his lab results.

## 2018-04-30 NOTE — Telephone Encounter (Signed)
Report needs to be refaxed but [employer] is normally life insurance. Please clarify reason we need report

## 2018-05-21 ENCOUNTER — Ambulatory Visit (INDEPENDENT_AMBULATORY_CARE_PROVIDER_SITE_OTHER): Payer: 59 | Admitting: Gastroenterology

## 2018-05-21 ENCOUNTER — Encounter: Payer: Self-pay | Admitting: Gastroenterology

## 2018-05-21 VITALS — BP 116/84 | HR 80 | Ht 73.0 in | Wt 197.1 lb

## 2018-05-21 DIAGNOSIS — K5909 Other constipation: Secondary | ICD-10-CM

## 2018-05-21 DIAGNOSIS — K625 Hemorrhage of anus and rectum: Secondary | ICD-10-CM

## 2018-05-21 NOTE — Progress Notes (Signed)
[facility] Consult Note:  History: Jacob Herring 05/21/2018  Referring physician: Shelva Majestic, MD  Reason for consult/chief complaint: Rectal Bleeding (BRB on the toilet paper and in the toilet, the harder the stool is there is blood but when its soft there in no blood)   Subjective  HPI:  This is a very pleasant 32 year old man referred by Dr. Durene Cal at primary care for recent rectal bleeding.  In office visit with Dr. Durene Cal in late April notes a few episodes of bright red blood per rectum in the setting of constipation.  Patient was treated with [employer] and was trying to change his diet to relieve constipation.  Jacob Herring is here with his wife, Jacob Herring.  He reports that if he remembers to take the Dukas 8, he generally has regular bowel movements.  When the stool is soft with no straining, there is no bleeding.  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  Because of this, he often works in the heat and is prone to dehydration. He has tried to change his schedule so he wakes up very early, is able to eat and exercise and take his stool softener and try to have a bowel movement before he goes to work.  He and his wife were just increasingly concerned because of the intermittent bleeding during periods of constipation area there is also some occasional pain with defecation.  ROS:  Review of Systems  Constitutional: Negative for appetite change and unexpected weight change.  HENT: Negative for mouth sores and voice change.   Eyes: Negative for pain and redness.  Respiratory: Negative for cough and shortness of breath.   Cardiovascular: Negative for chest pain and palpitations.  Genitourinary: Negative for dysuria and hematuria.  Musculoskeletal: Negative for arthralgias and myalgias.  Skin:  Negative for pallor and rash.  Neurological: Negative for weakness and headaches.  Hematological: Negative for adenopathy.   He is a healthy individual overall  Past Medical History: Past Medical History:  Diagnosis Date  . Alcohol abuse   . Anxiety      Past Surgical History: Past Surgical History:  Procedure Laterality Date  . ANTERIOR CRUCIATE LIGAMENT REPAIR Right 01/27/2011     Family History: Family History  Problem Relation Age of Onset  . Alcohol abuse Mother   . Drug abuse Mother   . Lung cancer Maternal Grandfather   . Other Father        chronic back pain  . Obesity Father   . Colon polyps Father        not sure of age this started  . Sudden death Cousin        before age 44 PNA leading to sepsis in cousin- tough for patient    Social History: Social History   Socioeconomic History  . Marital status: Married    Spouse name: Not on file  . Number of children: 0  . Years of education: Not on file  . Highest education level: Not on file  Occupational History  . Occupation: Consulting civil engineer  . Financial resource strain: Not on file  . Food insecurity:    Worry: Not on file    Inability: Not on file  . Transportation needs:    Medical: Not on file    Non-medical:  Not on file  Tobacco Use  . Smoking status: Current Every Day Smoker    Packs/day: 0.50    Years: 6.00    Pack years: 3.00    Types: Cigarettes  . Smokeless tobacco: Never Used  Substance and Sexual Activity  . Alcohol use: Yes    Comment: 2 per day  . Drug use: No  . Sexual activity: Yes    Birth control/protection: None  Lifestyle  . Physical activity:    Days per week: Not on file    Minutes per session: Not on file  . Stress: Not on file  Relationships  . Social connections:    Talks on phone: Not on file    Gets together: Not on file    Attends religious service: Not on file    Active member of club or organization: Not on file    Attends meetings of clubs or  organizations: Not on file    Relationship status: Not on file  Other Topics Concern  . Not on file  Social History Narrative   Married. Wife Jacob Herring patient of Dr. Durene Cal   Lives in [place] and owns home as of 2017      Works as Psychologist, occupational. HS degree    Allergies: Allergies  Allergen Reactions  . Penicillins Anaphylaxis    Outpatient Meds: Current Outpatient Medications  Medication Sig Dispense Refill  . docusate sodium (COLACE) 100 MG capsule Take 100 mg by mouth.     No current facility-administered medications for this visit.       ___________________________________________________________________ Objective   Exam:  BP 116/84 (BP Location: Left Arm, Patient Position: Sitting, Cuff Size: Normal)   Pulse 80   Ht  (1.854 m) Comment: height measured without shoes  Wt 197 lb 2 oz (89.4 kg)   BMI 26.01 kg/m    General: this is a(n) well-appearing man  Eyes: sclera anicteric, no redness  ENT: oral mucosa moist without lesions, no cervical or supraclavicular lymphadenopathy, good dentition  CV: RRR without murmur, S1/S2, no JVD, no peripheral edema  Resp: clear to auscultation bilaterally, normal RR and effort noted  GI: soft, no tenderness, with active bowel sounds. No guarding or palpable organomegaly noted.  Skin; warm and dry, no rash or jaundice noted  Neuro: awake, alert and oriented x 3. Normal gross motor function and fluent speech Rectal: Normal external, no fissure or tenderness.  No palpable distal rectal lesion, soft stool in the rectal vault. Anoscopy: Normal distal rectal mucosa, no internal hemorrhoids  Labs:  CBC Latest Ref Rng & Units 04/27/2018 04/15/2018 04/15/2018  WBC 3.4 - 10.8 x10E3/uL 5.7 5.0 5.0  Hemoglobin 13.0 - 17.7 g/dL 14.7 82.9 56.2  Hematocrit 37.5 - 51.0 % 45.6 44.1 44  Platelets 150 - 379 x10E3/uL 233 222 222     Assessment: Encounter Diagnoses  Name Primary?  . Rectal bleeding Yes  . Chronic constipation     Constipation that seems largely related to his work schedule.  When it is adequately treated, the stool is soft and he has no bleeding. Given today's findings, this is benign anorectal bleeding related to constipation.  Plan:  Continue daily stool softener, stay adequately hydrated.  To the extent that he can do so, staying on a regular eating and bathroom schedule will help. If the above is not completely helpful, he can take a small dose of MiraLAX such as a half capful in the evening so it might help have an easier bowel movement by  the following morning.  I do not think he needs further testing such as colonoscopy at this point.  I would gladly reevaluate him as needed.  Thank you for the courtesy of this consult.  Please call me with any questions or concerns.  Charlie Pitter III  CC: Shelva Majestic, MD

## 2018-05-21 NOTE — Patient Instructions (Signed)
If you are age 34 or older, your body mass index should be between 23-30. Your Body mass index is 26.01 kg/m. If this is out of the aforementioned range listed, please consider follow up with your Primary Care Provider.  If you are age 10 or younger, your body mass index should be between 19-25. Your Body mass index is 26.01 kg/m. If this is out of the aformentioned range listed, please consider follow up with your Primary Care Provider.   Follow up as needed.  It was a pleasure to meet you today!  Dr. Myrtie Neither

## 2018-10-21 IMAGING — US US ART/VEN ABD/PELV/SCROTUM DOPPLER LTD
1 series · 14 of 25 positions shown · non-contrast
Comparison: None.

CLINICAL DATA: Right testicular pain

EXAM:
SCROTAL ULTRASOUND
DOPPLER ULTRASOUND OF THE TESTICLES
TECHNIQUE: Complete ultrasound examination of the testicles, epididymis, and
other scrotal structures was performed. Color and spectral Doppler
ultrasound were also utilized to evaluate blood flow to the
testicles.

[Series 1: us art/ven abd/pelv/scrotum doppler ltd · 0.08mm/px · 14 of 79 slices shown]
[im 1/79]
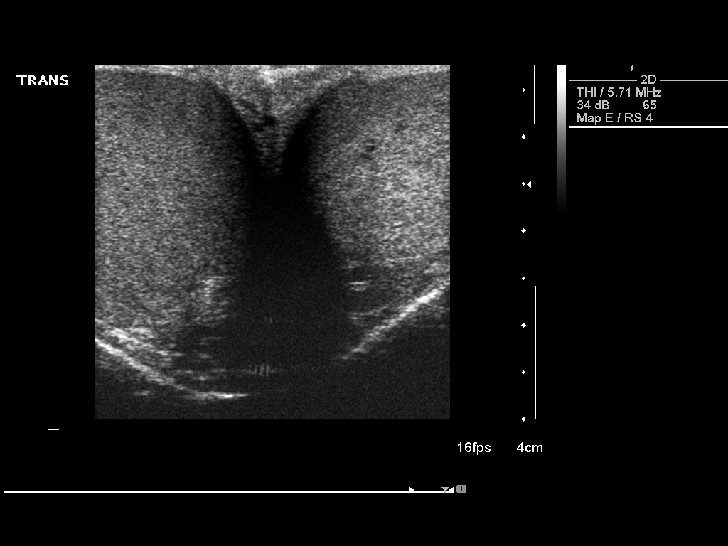
[im 7/79]
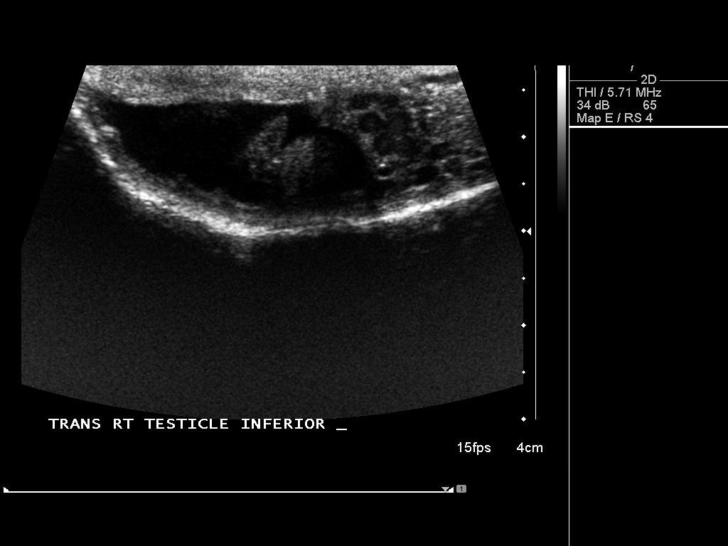
[im 14/79]
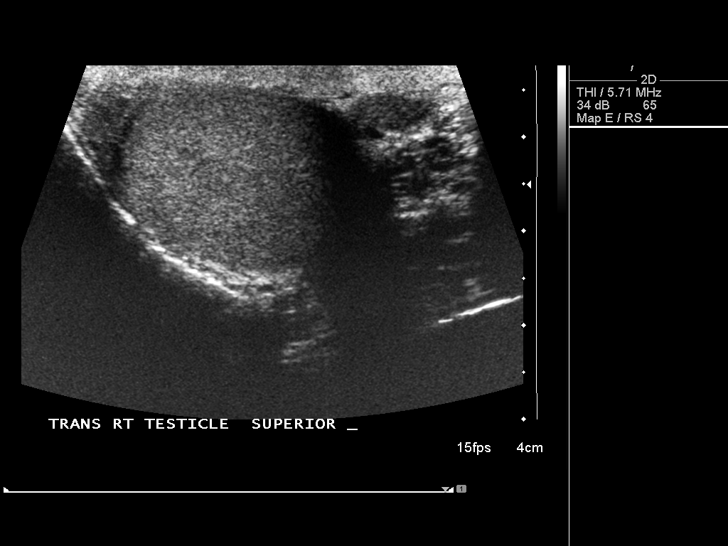
[im 20/79]
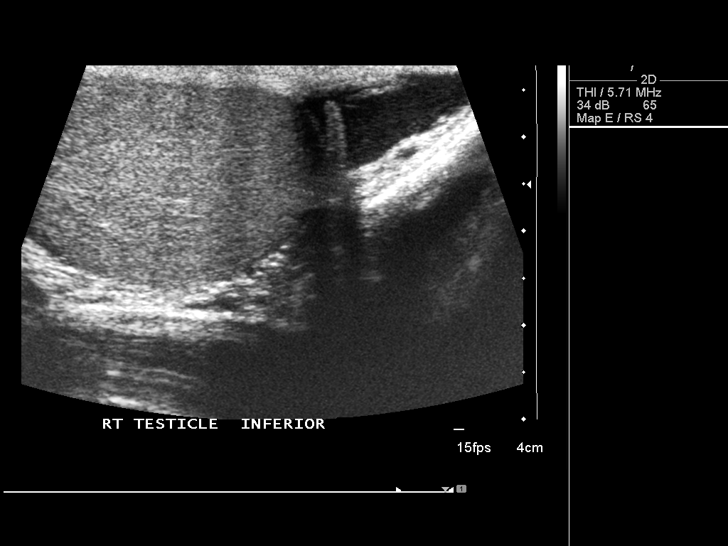
[im 27/79]
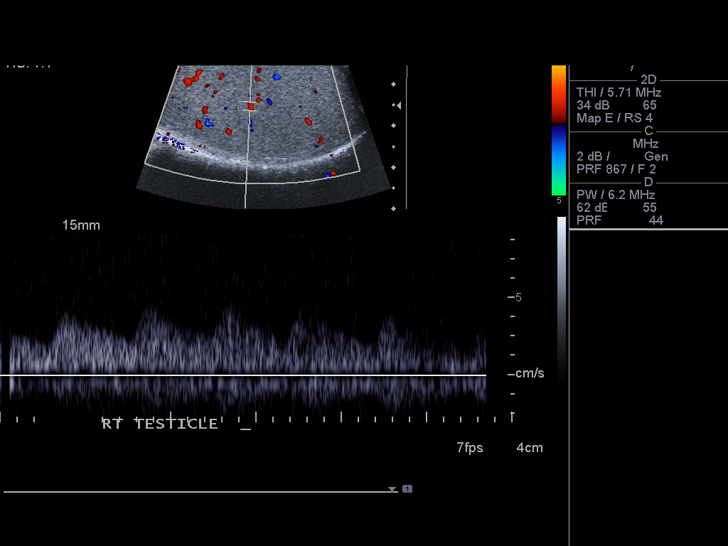
[im 30/79]
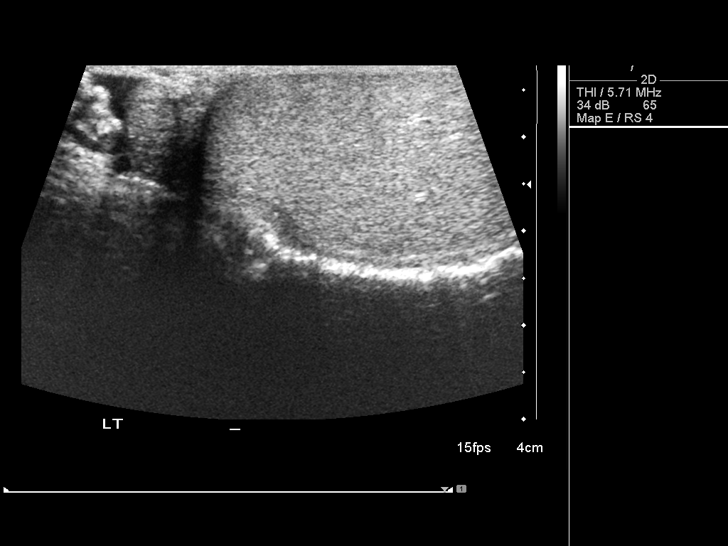
[im 36/79]
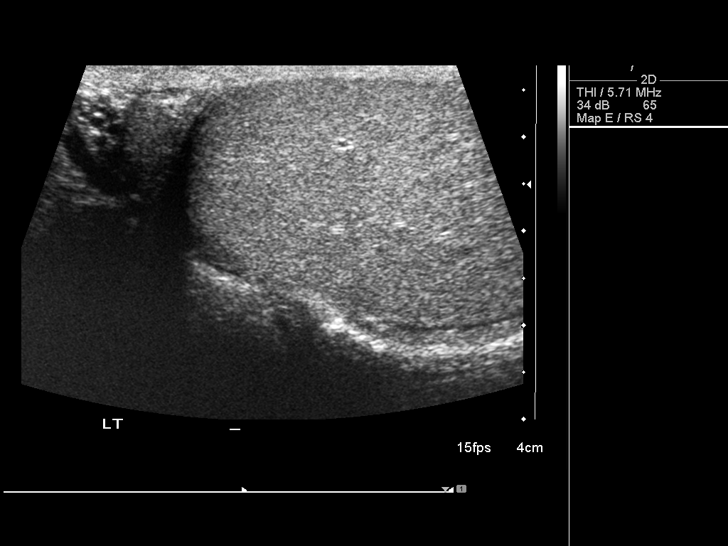
[im 43/79]
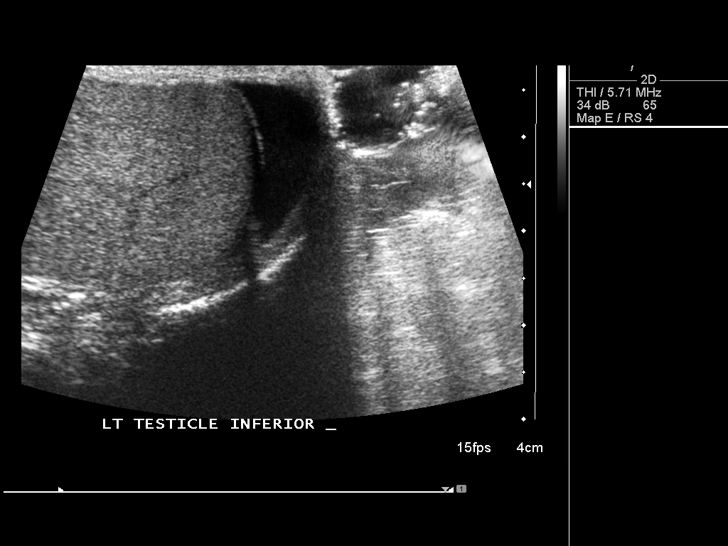
[im 49/79]
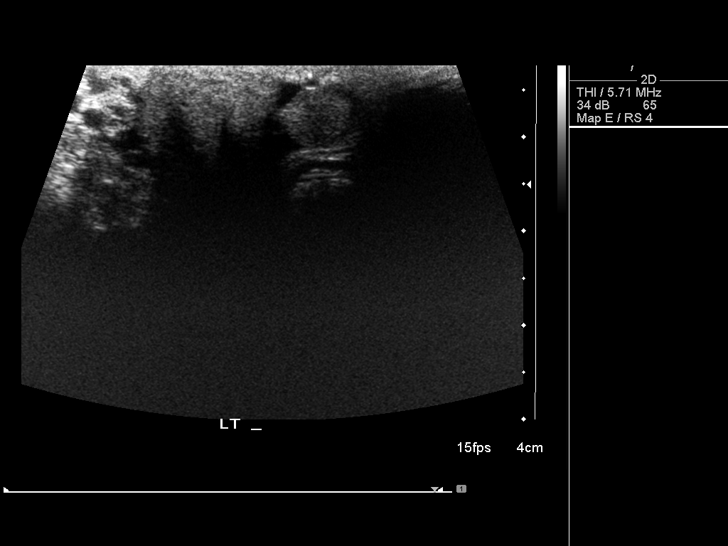
[im 53/79]
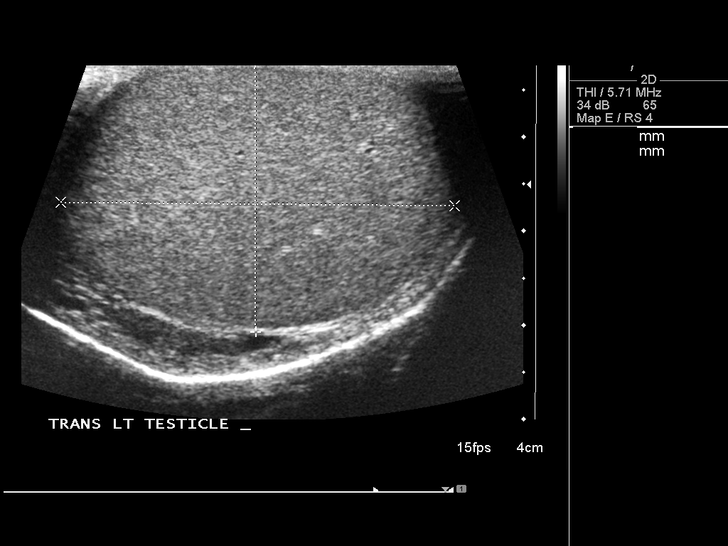
[im 59/79]
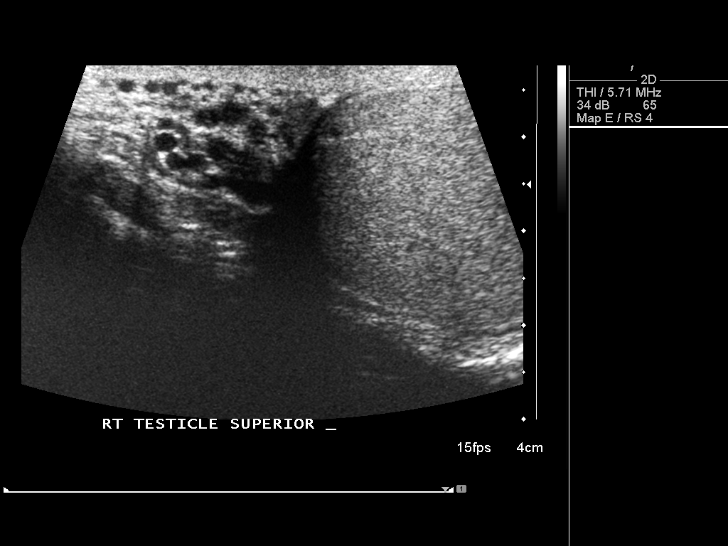
[im 66/79]
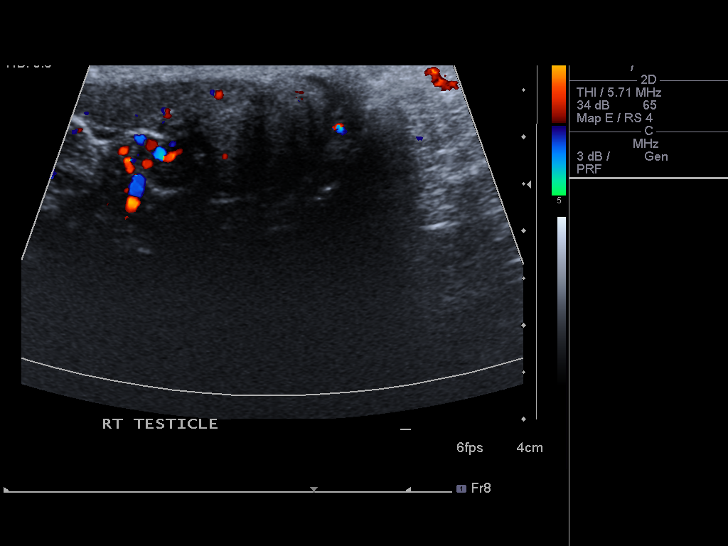
[im 72/79]
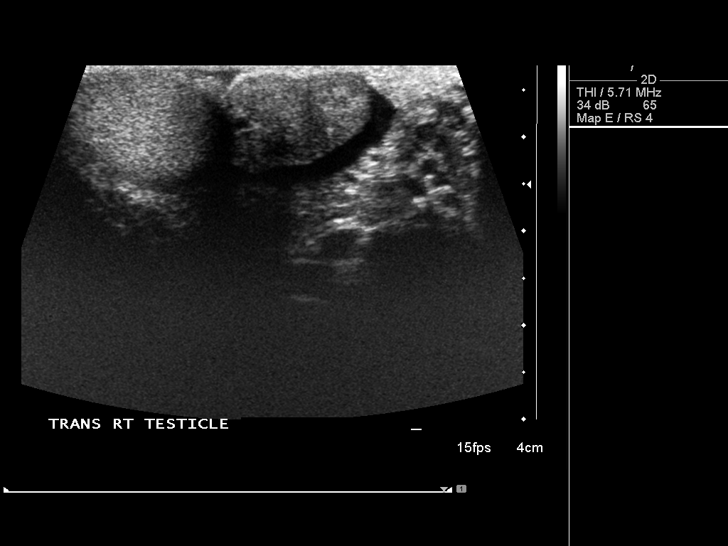
[im 79/79]
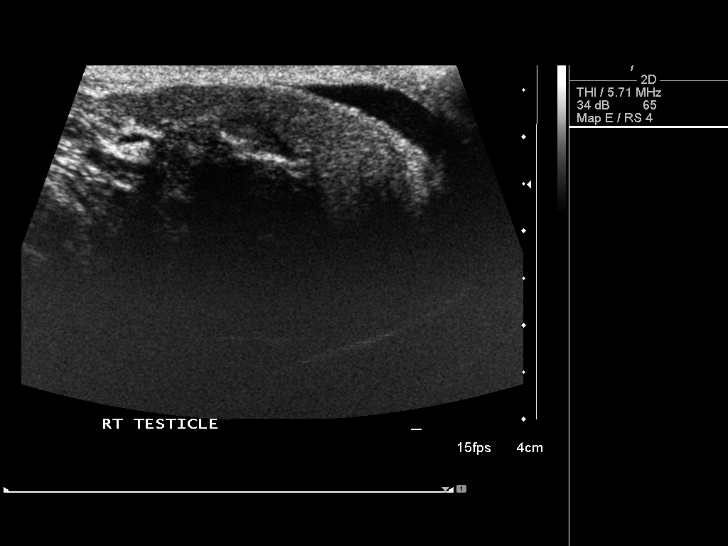

[14 of 25 positions shown; findings below may reference images not displayed]

FINDINGS: Right testicle

Measurements: 5.1 x 2.8 x 4.0 cm. No mass or microlithiasis
visualized.

Left testicle

Measurements: 4.3 x 2.9 x 4.2 cm. No mass or microlithiasis
visualized.

Right epididymis:  Thickened appearance of the epididymal tail.

Left epididymis:  Normal in size and appearance.

Hydrocele:  Small bilateral.

Varicocele:  None visualized.

Pulsed Doppler interrogation of both testes demonstrates normal low
resistance arterial and venous waveforms bilaterally.
IMPRESSION: No testicular abnormality.

Thickened appearance of the tail of the right epididymis, question
focal area of epididymitis.

Small bilateral hydroceles.

## 2022-06-06 ENCOUNTER — Telehealth: Payer: Self-pay | Admitting: Family Medicine

## 2022-06-06 NOTE — Telephone Encounter (Signed)
Pt is scheduled with Dr Yong Channel on 06/09/22 at 2pm  Patient Name: Jacob Herring IS Gender: Male DOB: 1986-11-01 Age: 36 Y 9 M 10 D Return Phone Number: [phone] (Primary) Address: City/ State/ Zip: [place]  [address] Client [facility] Client Site [facility] Provider Garret Reddish- MD Contact Type Call Who Is Calling Patient / Member / Family / Caregiver Call Type Triage / Clinical Relationship To Patient Self Return Phone Number [phone] (Primary) Chief Complaint testicular symptom (non urgent symptom) Reason for Call Symptomatic / Request for [place] has abnormalities in his testicles, no pain. Translation No Nurse Assessment Nurse: Ysidro Evert, RN, Levada Dy Date/Time (Eastern Time): 06/06/2022 8:25:53 AM Confirm and document reason for call. If symptomatic, describe symptoms. ---Caller states he has lump under the skin in his left testicle that he just noticed. It isn't painful. No fever Does the patient have any new or worsening symptoms? ---Yes Will a triage be completed? ---Yes Related visit to physician within the last 2 weeks? ---No Does the PT have any chronic conditions? (i.e. diabetes, asthma, this includes High risk factors for pregnancy, etc.) ---No Is this a behavioral health or substance abuse call? ---No Guidelines Guideline Title Affirmed Question Affirmed Notes Nurse Date/Time Eilene Ghazi Time) Penis and Scrotum Symptoms All other penis - scrotum symptoms (Exception: Painless rash < 24 hours duration.) Ysidro Evert, RN, Levada Dy 06/06/2022 8:27:38 AM Disp. Time Eilene Ghazi Time) Disposition Final User 06/06/2022 8:30:36 AM SEE PCP WITHIN 3 DAYS Yes Ysidro Evert, RN, Marin Shutter Disagree/Comply Comply Caller Understands Yes PreDisposition Did not know what to do Care Advice Given Per Guideline SEE PCP WITHIN 3 DAYS: * You need to be seen within 2 or 3 days.  CALL BACK IF: * Fever or pain occur * You become worse CARE ADVICE given per Penis and Scrotum Symptoms (Adult) guideline. Referrals REFERRED TO PCP OFFICE

## 2022-06-09 ENCOUNTER — Ambulatory Visit (INDEPENDENT_AMBULATORY_CARE_PROVIDER_SITE_OTHER): Payer: 59 | Admitting: Family Medicine

## 2022-06-09 ENCOUNTER — Encounter: Payer: Self-pay | Admitting: Family Medicine

## 2022-06-09 VITALS — BP 130/80 | HR 78 | Temp 99.2°F | Ht 73.0 in | Wt 205.6 lb

## 2022-06-09 DIAGNOSIS — N5089 Other specified disorders of the male genital organs: Secondary | ICD-10-CM | POA: Diagnosis not present

## 2022-06-09 DIAGNOSIS — Z720 Tobacco use: Secondary | ICD-10-CM | POA: Diagnosis not present

## 2022-06-09 NOTE — Progress Notes (Signed)
Phone [phone] In person visit   Subjective:   Jacob Herring is a 36 y.o. year old very pleasant male patient who presents for/with See problem oriented charting Chief Complaint  Patient presents with   Establish Care   abnormal testicle    Pt c/o abnormality on testicle with mild discomfort on the back side.    Past Medical History-  Patient Active Problem List   Diagnosis Date Noted   Family history of colonic polyps 01/26/2017   Alcohol abuse    Medications- reviewed and updated Current Outpatient Medications  Medication Sig Dispense Refill   Melatonin 5 MG CAPS Take by mouth. Every other night     milk thistle 175 MG tablet Take 175 mg by mouth daily.     Multiple Vitamin (MULTIVITAMIN) tablet Take 1 tablet by mouth daily.     No current facility-administered medications for this visit.     Objective:  BP 130/80   Pulse 78   Temp 99.2 F (37.3 C)   Ht 6\' 1"  (1.854 m)   Wt 205 lb 9.6 oz (93.3 kg)   SpO2 96%   BMI 27.13 kg/m  Gen: NAD, resting comfortably CV: RRR no murmurs rubs or gallops Lungs: CTAB no crackles, wheeze, rhonchi Abdomen: soft/nontender/nondistended/normal bowel sounds. No rebound or guarding.  Ext: no edema Skin: warm, dry GU: Right testicular exam, on left testicle very small nodular lesion less than 5 mm noted- firm but similar texture to rest of testicle    Assessment and Plan   #Abnormal testicle S: Usually checks testicles every 3 months- last week  He noteed mild abnormality/bump on left testicle.  No discomfort noted on the left side. No pain. No dysuria or polyuria. No fever or chills. No rectal pain.   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  He has no children, by choice, and has no GU symptoms.  No history of GU infections, and has been covered for infections by a physician.  He drinks water and beer only, and no sodas.  No history of stones his exam is normal.  No hernia, no epididymitis or cystic disease, and normal testes.  His ultrasound shows hydrocele only.  He will have a CT with and without IV contrast to rule out small stone low in ureter and to rule out right testes pathology and varicocele missed on exam and ultrasound if normal he will placed on 1 month of meloxicam)" from Dr. Gaynelle Arabian  Today reports ongoing issues with right side but no worsening. He reports no stone was found. Even with a month of meloxicam symptoms never resolved.  A/P: Left testicular mass very small <5 mm- get ultrasound to rule out malignancy.  Thankfully no longer having right testicular issues  #Saw GI in 2019 for rectal bleeding- did better coming off peanuts and starting kombucha- was thought constipation related- no further issues in recent years  #Tobacco use- 1 pack per week- trying to work with wife to quit together.  More social- still encouraged to quit.    Recommended follow up: Return in about 6 months (around 12/09/2022) for physical or sooner if needed.Schedule b4 you leave. Future Appointments  Date Time Provider [place]  12/05/2022 10:00 AM Marin Olp, MD [facility]   Lab/Order associations:   ICD-10-CM   1. Mass of left testicle  N50.89 US SCROTUM W/DOPPLER    CANCELED: US SCROTUM W/DOPPLER    2. Tobacco use  Z72.0      Return precautions advised.  Jacob Reddish, MD

## 2022-06-09 NOTE — Patient Instructions (Addendum)
We will call you within two weeks about your referral for ultrasound of testicles. If you do not hear within 2 weeks, give [place] a call or call [place] [facility]  Recommended follow up: Return in about 6 months (around 12/09/2022) for physical or sooner if needed.Schedule b4 you leave.

## 2022-06-27 ENCOUNTER — Ambulatory Visit (HOSPITAL_COMMUNITY)
Admission: RE | Admit: 2022-06-27 | Discharge: 2022-06-27 | Disposition: A | Discharge status: Home / Self Care | Payer: 59 | Source: Ambulatory Visit | Attending: Family Medicine | Admitting: Family Medicine

## 2022-06-27 DIAGNOSIS — N5089 Other specified disorders of the male genital organs: Secondary | ICD-10-CM | POA: Insufficient documentation

## 2022-07-04 ENCOUNTER — Telehealth: Payer: Self-pay | Admitting: Family Medicine

## 2022-07-04 NOTE — Telephone Encounter (Signed)
Returned pt call and lab reviewed.

## 2022-09-22 ENCOUNTER — Encounter: Payer: Self-pay | Admitting: *Deleted

## 2022-12-05 ENCOUNTER — Encounter: Payer: Self-pay | Admitting: Family Medicine

## 2022-12-05 ENCOUNTER — Ambulatory Visit (INDEPENDENT_AMBULATORY_CARE_PROVIDER_SITE_OTHER): Payer: 59 | Admitting: Family Medicine

## 2022-12-05 VITALS — BP 122/79 | HR 69 | Temp 98.9°F | Ht 73.0 in | Wt 205.0 lb

## 2022-12-05 DIAGNOSIS — Z130 Encounter for screening for diseases of the blood and blood-forming organs and certain disorders involving the immune mechanism: Secondary | ICD-10-CM | POA: Diagnosis not present

## 2022-12-05 DIAGNOSIS — Z0000 Encounter for general adult medical examination without abnormal findings: Secondary | ICD-10-CM

## 2022-12-05 DIAGNOSIS — Z13220 Encounter for screening for lipoid disorders: Secondary | ICD-10-CM

## 2022-12-05 DIAGNOSIS — Z87891 Personal history of nicotine dependence: Secondary | ICD-10-CM

## 2022-12-05 DIAGNOSIS — Z87898 Personal history of other specified conditions: Secondary | ICD-10-CM

## 2022-12-05 DIAGNOSIS — M25522 Pain in left elbow: Secondary | ICD-10-CM

## 2022-12-05 LAB — LIPID PANEL
Cholesterol: 189 mg/dL (ref 0–200)
HDL: 70.2 mg/dL (ref >39.00)
LDL Cholesterol: 109 mg/dL — ABNORMAL HIGH (ref 0–99)
NonHDL: 118.95
Total CHOL/HDL Ratio: 3
Triglycerides: 48 mg/dL (ref 0.0–149.0)
VLDL: 9.6 mg/dL (ref 0.0–40.0)

## 2022-12-05 LAB — CBC WITH DIFFERENTIAL/PLATELET
Basophils Absolute: 0 10*3/uL (ref 0.0–0.1)
Basophils Relative: 0.3 % (ref 0.0–3.0)
Eosinophils Absolute: 0.1 10*3/uL (ref 0.0–0.7)
Eosinophils Relative: 1.4 % (ref 0.0–5.0)
HCT: 47.6 % (ref 39.0–52.0)
Hemoglobin: 16.2 g/dL (ref 13.0–17.0)
Lymphocytes Relative: 26.2 % (ref 12.0–46.0)
Lymphs Abs: 1.8 10*3/uL (ref 0.7–4.0)
MCHC: 34 g/dL (ref 30.0–36.0)
MCV: 95.4 fl (ref 78.0–100.0)
Monocytes Absolute: 0.6 10*3/uL (ref 0.1–1.0)
Monocytes Relative: 9.1 % (ref 3.0–12.0)
Neutro Abs: 4.2 10*3/uL (ref 1.4–7.7)
Neutrophils Relative %: 63 % (ref 43.0–77.0)
Platelets: 254 10*3/uL (ref 150.0–400.0)
RBC: 4.99 Mil/uL (ref 4.22–5.81)
RDW: 12.8 % (ref 11.5–15.5)
WBC: 6.7 10*3/uL (ref 4.0–10.5)

## 2022-12-05 LAB — URINALYSIS, ROUTINE W REFLEX MICROSCOPIC
Bilirubin Urine: NEGATIVE
Hgb urine dipstick: NEGATIVE
Ketones, ur: NEGATIVE
Leukocytes,Ua: NEGATIVE
Nitrite: NEGATIVE
Specific Gravity, Urine: 1.01 (ref 1.000–1.030)
Total Protein, Urine: NEGATIVE
Urine Glucose: NEGATIVE
Urobilinogen, UA: 0.2 (ref 0.0–1.0)
pH: 6 (ref 5.0–8.0)

## 2022-12-05 LAB — COMPREHENSIVE METABOLIC PANEL
ALT: 21 U/L (ref 0–53)
AST: 20 U/L (ref 0–37)
Albumin: 4.9 g/dL (ref 3.5–5.2)
Alkaline Phosphatase: 53 U/L (ref 39–117)
BUN: 11 mg/dL (ref 6–23)
CO2: 28 mEq/L (ref 19–32)
Calcium: 10.2 mg/dL (ref 8.4–10.5)
Chloride: 101 mEq/L (ref 96–112)
Creatinine, Ser: 0.89 mg/dL (ref 0.40–1.50)
GFR: 108.89 mL/min (ref >60.00)
Glucose, Bld: 86 mg/dL (ref 70–99)
Potassium: 4.3 mEq/L (ref 3.5–5.1)
Sodium: 137 mEq/L (ref 135–145)
Total Bilirubin: 0.9 mg/dL (ref 0.2–1.2)
Total Protein: 7.8 g/dL (ref 6.0–8.3)

## 2022-12-05 NOTE — [facility] Notes (Signed)
Phone: [phone]    Subjective:  Patient presents today for their annual physical. Chief complaint-noted.   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 and can hurt to make a fist at times or can wake up hurting  The following were reviewed and entered/updated in epic: Past Medical History:  Diagnosis Date   Alcohol abuse    Anxiety    Patient Active Problem List   Diagnosis Date Noted   History of alcohol use     Priority: Low   Family history of colonic polyps 01/26/2017   Past Surgical History:  Procedure Laterality Date   ANTERIOR CRUCIATE LIGAMENT REPAIR Right 01/27/2011    Family History  Problem Relation Age of Onset   Alcohol abuse Mother    Drug abuse Mother    Lung cancer Maternal Grandfather    Other Father        chronic back pain   Obesity Father    Colon polyps Father        not sure of age this started   Sudden death Cousin        before age 56 PNA leading to sepsis in cousin- tough for patient    Medications- reviewed and updated Current Outpatient Medications  Medication Sig Dispense Refill   Melatonin 5 MG CAPS Take by mouth. Every other night     milk thistle 175 MG tablet Take 175 mg by mouth daily.     Multiple Vitamin (MULTIVITAMIN) tablet Take 1 tablet by mouth daily.     No current facility-administered medications for this visit.    Allergies-reviewed and updated Allergies  Allergen Reactions   Penicillins Anaphylaxis    Social History   Social History Narrative   Married. Wife Ayla patient of Dr. Durene Cal   Lives in [place] and owns home as of 2017      Works as Psychologist, occupational. HS degree      Objective:  BP 122/79 (BP Location: Right Arm, Patient Position: Sitting)   Pulse 69   Temp 98.9 F (37.2 C) (Temporal)   Ht 6\' 1"  (1.854 m)   Wt 205 lb (93 kg)   SpO2 98%   BMI 27.05  kg/m  Gen: NAD, resting comfortably HEENT: Mucous membranes are moist. Oropharynx normal Neck: no thyromegaly CV: RRR no murmurs rubs or gallops Lungs: CTAB no crackles, wheeze, rhonchi Abdomen: soft/nontender/nondistended/normal bowel sounds. No rebound or guarding.  Ext: no edema Skin: warm, dry Neuro: grossly normal, moves all extremities, PERRLA    Assessment and Plan:  36 y.o. male presenting for annual physical.  Health Maintenance counseling: 1. Anticipatory guidance: Patient counseled regarding regular dental exams -q6 months advise-d trying to get set up with new dentists, eye exams - no vision issues- no eye doctor- does occasionally use readers,  avoiding smoking and second hand smoke- quit for a month , limiting alcohol to 2 beverages per day- has cut way down to 9 per week, no illicit drugs.   2. Risk factor reduction:  Advised patient of need for regular exercise and diet rich and fruits and vegetables to reduce risk of heart attack and stroke.  Exercise- active with work- 4x a week outside of work as well - push ups, sit ups, squats.  Diet/weight management-reasonably healthy diet- BMI overestimates his weight as has good muscle mass.  Wt Readings from Last 3 Encounters:  12/05/22 205 lb (93 kg)  06/09/22 205 lb 9.6 oz (93.3 kg)  05/21/18 197 lb 2 oz (89.4 kg)  3. Immunizations/screenings/ancillary studies- holding off on further covid shots, opts out of flu shot Immunization History  Administered Date(s) Administered   Hep B, Unspecified 10/28/1996, 12/02/1996, 03/03/1997   PFIZER(Purple Top)SARS-COV-2 Vaccination 04/20/2020, 05/11/2020   Tdap 01/26/2017, 06/26/2022  4. Prostate cancer screening- no family history, start at age 32   5. Colon cancer screening - rectal bleeding around 2019 led to GI consult- saw Dr. Loletha Carrow- was related to a small cut and has healed- no furhter bleeding and with dads history was told 54 years old for colonoscopy 6. Skin cancer  screening/prevention- wants to establish with dermatology but he plans to call. advised regular sunscreen use. Denies worrisome, changing, or new skin lesions.  7. Testicular cancer screening- advised monthly self exams - does these 8. STD screening- patient opts out- only active with wife 60. Smoking associated screening- FORMER smoker- quit for 1 month ago. Will get UA. No cough  Status of chronic or acute concerns   #left elbow pain- x 11 months- can bother him with work -climbing stairs, gripping objects etc. Sometimes wakes up with pain. Worsened when temperatures dropped.  - refer to sports medicine today- I am not 100% clear on cause- not obviously just tennis elbow- hurts more into the lower part of humerus  # Testicular abnormality-patient noted a mild bump on left testicle prior to last visit that was less than 5 mm-we ordered ultrasound which showed "Tiny 3 mm tunica cyst at site of palpable finding in the LEFT testis, benign. Small BILATERAL hydroceles. Otherwise normal exam. " - Prior right testicular issues from 2018-see notes from that time as well as urology visit - no enlargement since that time  #screening hyperlipidemia- will check today  Recommended follow up: Return in about 1 year (around 12/06/2023) for physical or sooner if needed.Schedule b4 you leave.  Lab/Order associations: fasting   ICD-10-CM   1. Preventative health care  Z00.00 CBC with Differential/Platelet    Comprehensive metabolic panel    Lipid panel    Urinalysis, Routine w reflex microscopic    2. Left elbow pain  M25.522 Ambulatory referral to Sports Medicine    3. Screening for hyperlipidemia  Z13.220 Comprehensive metabolic panel    Lipid panel    4. Screening, iron deficiency anemia  Z13.0 CBC with Differential/Platelet    5. Former smoker  Z87.891 Urinalysis, Routine w reflex microscopic    6. History of alcohol use  Z87.898       No orders of the defined types were placed in this  encounter.   Return precautions advised.   Garret Reddish, MD

## 2022-12-05 NOTE — Patient Instructions (Addendum)
We will call you within two weeks about your referral to [facility] to look at the elbow. If you do not hear within 2 weeks, give [place] a call.   Schedule with new dentist  wants to establish with dermatology but he plans to call. If they need referral we can place for skin cancer screening  Please stop by lab before you go If you have mychart- we will send your results within 3 business days of [place] receiving them.  If you do not have mychart- we will call you about results within 5 business days of [place] receiving them.  *please also note that you will see labs on mychart as soon as they post. I will later go in and write notes on them- will say "notes from Dr. Durene Cal"   Recommended follow up: Return in about 1 year (around 12/06/2023) for physical or sooner if needed.Schedule b4 you leave.

## 2022-12-15 ENCOUNTER — Encounter: Payer: Self-pay | Admitting: Family Medicine

## 2023-12-08 ENCOUNTER — Ambulatory Visit (INDEPENDENT_AMBULATORY_CARE_PROVIDER_SITE_OTHER): Payer: Managed Care, Other (non HMO) | Admitting: Family Medicine

## 2023-12-08 ENCOUNTER — Encounter: Payer: Self-pay | Admitting: Family Medicine

## 2023-12-08 VITALS — BP 102/64 | HR 58 | Temp 97.3°F | Ht 73.0 in | Wt 211.8 lb

## 2023-12-08 DIAGNOSIS — Z87891 Personal history of nicotine dependence: Secondary | ICD-10-CM

## 2023-12-08 DIAGNOSIS — Z130 Encounter for screening for diseases of the blood and blood-forming organs and certain disorders involving the immune mechanism: Secondary | ICD-10-CM

## 2023-12-08 DIAGNOSIS — Z13220 Encounter for screening for lipoid disorders: Secondary | ICD-10-CM

## 2023-12-08 DIAGNOSIS — Z0000 Encounter for general adult medical examination without abnormal findings: Secondary | ICD-10-CM

## 2023-12-08 NOTE — [facility] Notes (Signed)
Phone: [phone]    Subjective:  Patient presents today for their annual physical. Chief complaint-noted.   See problem oriented charting- ROS- full  review of systems was completed and negative  Per full ROS sheet completed by patient  The following were reviewed and entered/updated in epic: Past Medical History:  Diagnosis Date   Alcohol abuse    Anxiety    Patient Active Problem List   Diagnosis Date Noted   History of alcohol use     Priority: Low   Family history of colonic polyps 01/26/2017   Past Surgical History:  Procedure Laterality Date   ANTERIOR CRUCIATE LIGAMENT REPAIR Right 01/27/2011    Family History  Problem Relation Age of Onset   Alcohol abuse Mother    Drug abuse Mother    Lung cancer Maternal Grandfather    Other Father        chronic back pain   Obesity Father    Colon polyps Father        not sure of age this started   Sudden death Cousin        before age 56 PNA leading to sepsis in cousin- tough for patient    Medications- reviewed and updated Current Outpatient Medications  Medication Sig Dispense Refill   Melatonin 5 MG CAPS Take by mouth. Every other night     milk thistle 175 MG tablet Take 175 mg by mouth daily.     Multiple Vitamin (MULTIVITAMIN) tablet Take 1 tablet by mouth daily.     No current facility-administered medications for this visit.    Allergies-reviewed and updated Allergies  Allergen Reactions   Penicillins Anaphylaxis    Social History   Social History Narrative   Married. Wife Ayla patient of Dr. Durene Cal. 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 Oropharynx  normal Neck: no thyromegaly CV: RRR no murmurs rubs or gallops Lungs: CTAB no crackles, wheeze, rhonchi Abdomen: soft/nontender/nondistended/normal bowel sounds. No rebound or guarding.  Ext: no edema Skin: warm, dry Neuro: grossly normal, moves all extremities, PERRLA Male genitalia: penis: no lesions (several small slightly red areas around glans and just below glans penis- one of these with mild scaling) or discharge. No ulcers.      Assessment and Plan:  37 y.o. male presenting for annual physical.  Health Maintenance counseling: 1. Anticipatory guidance: Patient counseled regarding regular dental exams - advised q6 months, eye exams - had department of transportation ([employer] physical and right eye slightly weaker- he is planning to see optho,  avoiding smoking and second hand smoke, limiting alcohol to 2 beverages per day- about 9 a week, no illicit drugs.   2. Risk factor reduction:  Advised patient of need for regular exercise and diet rich and fruits and vegetables to reduce risk of heart attack and stroke.  Exercise- active with work and also 4x a week outside of work- push ups, abs , etc .  Diet/weight management-BMI overestimates risks as he  has good muscle mass- he has increased and increased muscle mass Wt Readings from Last 3 Encounters:  12/08/23 211 lb 12.8 oz (96.1 kg)  12/05/22 205 lb (93 kg)  06/09/22 205 lb 9.6 oz (93.3 kg)  3. Immunizations/screenings/ancillary studies- opts out flu and COVID. Donated blood in past so hold off on HIV and Hepatitis C Virus (HCV) screen Immunization History  Administered Date(s) Administered   Hep B, Unspecified 10/28/1996, 12/02/1996, 03/03/1997   PFIZER(Purple Top)SARS-COV-2 Vaccination 04/20/2020, 05/11/2020   Tdap 01/26/2017, 06/26/2022  4. Prostate cancer screening- no family history, start at age 82  5. Colon cancer screening -   rectal bleeding around 2019 led to GI consult- saw Dr. Myrtie Neither- was related to a small cut and has  healed- no further bleeding and with dads history was told 70 years old for colonoscopy - he may reach out around his birthday 6. Skin cancer screening/prevention- saw dermatology within last year- good report. advised regular sunscreen use. Denies worrisome, changing, or new skin lesions.  7. Testicular cancer screening- advised monthly self exams - no new lumps or bumps 8. STD screening- patient opts out- only active with wife 9. Smoking associated screening- former smoker- quit 13 months ago at least. Will get urinalysis   Status of chronic or acute concerns   # Testicular abnormality-patient noted a mild bump on left testicle prior to at least 2 visits ago that was less than 5 mm-we ordered ultrasound which showed "Tiny 3 mm tunica cyst at site of palpable finding in the LEFT testis, benign. Small BILATERAL hydroceles. Otherwise normal exam. " - Prior right testicular issues from 2018-see notes from that time as well as urology visit - no enlargement since that time  #New harness at work and has had a few times where penis has gotten pinched or abraded- has some spots that he wants checked out . Appear to be mildly irritated areas likely from friction. No pain. No risk sexual transmitted infection  -Try Vaseline on the penis on irritated areas- if this doesn't begin to improve within 2-3 weeks or worsens let me know and can refer to urology  #hyperlipidemia-will screen for this today.  Lab Results  Component Value Date   CHOL 189 12/05/2022   HDL 70.20 12/05/2022   LDLCALC 109 (H) 12/05/2022   TRIG 48.0 12/05/2022   CHOLHDL 3 12/05/2022   Recommended follow up: Return in about 1 year (around 12/07/2024) for physical or sooner if needed.Schedule b4 you leave.  Lab/Order associations: fasting   ICD-10-CM   1. Preventative health care  Z00.00     2. Former smoker  Z87.891 Urinalysis, Routine w reflex microscopic    3. Screening for hyperlipidemia  Z13.220 Comprehensive metabolic  panel    Lipid panel    4. Screening, iron deficiency anemia  Z13.0 CBC with Differential/Platelet      No orders of the defined types were placed in this encounter.   Return precautions advised.   Tana Conch, MD

## 2023-12-08 NOTE — Addendum Note (Signed)
Addended by: Lorn Junes on: 12/08/2023 12:50 PM   Modules accepted: Orders

## 2023-12-08 NOTE — Patient Instructions (Addendum)
Get set up with new dentist  Try Vaseline on the penis on irritated areas twice a day- if this doesn't begin to improve within 2-3 weeks or worsens let me know and can refer to urology  Please stop by lab before you go If you have mychart- we will send your results within 3 business days of [place] receiving them.  If you do not have mychart- we will call you about results within 5 business days of [place] receiving them.  *please also note that you will see labs on mychart as soon as they post. I will later go in and write notes on them- will say "notes from Dr. Durene Cal"   Recommended follow up: Return in about 1 year (around 12/07/2024) for physical or sooner if needed.Schedule b4 you leave.

## 2023-12-18 ENCOUNTER — Telehealth: Payer: Self-pay

## 2023-12-18 NOTE — Telephone Encounter (Signed)
Copied from CRM [id number]. Topic: Clinical - Request for Lab/Test Order >> Dec 18, 2023  8:45 AM Deaijah H wrote: Reason for CRM: [facility] called in due to needing patient to redo Cmp 14/lipid due to lab processing error

## 2023-12-26 LAB — CBC WITH DIFFERENTIAL/PLATELET
Basophils Absolute: 0 10*3/uL (ref 0.0–0.2)
Basos: 1 %
EOS (ABSOLUTE): 0.2 10*3/uL (ref 0.0–0.4)
Eos: 4 %
Hematocrit: 48.2 % (ref 37.5–51.0)
Hemoglobin: 16 g/dL (ref 13.0–17.7)
Immature Grans (Abs): 0 10*3/uL (ref 0.0–0.1)
Immature Granulocytes: 1 %
Lymphocytes Absolute: 1.8 10*3/uL (ref 0.7–3.1)
Lymphs: 28 %
MCH: 32.3 pg (ref 26.6–33.0)
MCHC: 33.2 g/dL (ref 31.5–35.7)
MCV: 97 fL (ref 79–97)
Monocytes Absolute: 0.5 10*3/uL (ref 0.1–0.9)
Monocytes: 7 %
Neutrophils Absolute: 4 10*3/uL (ref 1.4–7.0)
Neutrophils: 59 %
Platelets: 229 10*3/uL (ref 150–450)
RBC: 4.96 x10E6/uL (ref 4.14–5.80)
RDW: 12.5 % (ref 11.6–15.4)
WBC: 6.5 10*3/uL (ref 3.4–10.8)

## 2023-12-26 LAB — LIPID PANEL

## 2023-12-26 LAB — COMPREHENSIVE METABOLIC PANEL

## 2023-12-26 LAB — URINALYSIS, ROUTINE W REFLEX MICROSCOPIC
Bilirubin, UA: NEGATIVE
Glucose, UA: NEGATIVE
Ketones, UA: NEGATIVE
Leukocytes,UA: NEGATIVE
Nitrite, UA: NEGATIVE
Protein,UA: NEGATIVE
RBC, UA: NEGATIVE
Specific Gravity, UA: 1.009 (ref 1.005–1.030)
Urobilinogen, Ur: 0.2 mg/dL (ref 0.2–1.0)
pH, UA: 7 (ref 5.0–7.5)

## 2023-12-28 ENCOUNTER — Telehealth: Payer: Self-pay | Admitting: Family Medicine

## 2023-12-28 NOTE — Telephone Encounter (Signed)
[facility] will get a technician to follow up to the reason why they could not process the tube for CMP and Lipid.   I have left patient a vm to call back regarding labs that were not able to be processed at [facility] in order to get ordered again and scheduled.

## 2024-02-05 ENCOUNTER — Telehealth: Payer: Self-pay

## 2024-02-05 ENCOUNTER — Other Ambulatory Visit: Payer: Self-pay

## 2024-02-05 DIAGNOSIS — N4889 Other specified disorders of penis: Secondary | ICD-10-CM

## 2024-02-05 NOTE — Telephone Encounter (Signed)
 Referral to urology has been placed.  Copied from CRM [id number]. Topic: Referral - Request for Referral >> Feb 05, 2024 12:47 PM Tiffany H wrote: Did the patient discuss referral with their provider in the last year? Yes (If No - schedule appointment) (If Yes - send message)  Appointment offered? Yes  Type of order/referral and detailed reason for visit:  Instructions   Return in about 1 year (around 12/07/2024) for physical or sooner if needed.Schedule b4 you leave. Get set up with new dentist   Try Vaseline on the penis on irritated areas twice a day- if this doesn't begin to improve within 2-3 weeks or worsens let me know and can refer to urology.  Preference of office, provider, location: [place] or somewhere close to [place].   If referral order, have you been seen by this specialty before? Yes. Jacob Herring.   (If Yes, this issue or another issue? When? Where?  Can we respond through MyChart? Yes.

## 2024-12-08 ENCOUNTER — Ambulatory Visit: Payer: Managed Care, Other (non HMO) | Admitting: Family Medicine

## 2024-12-08 VITALS — BP 128/70 | HR 54 | Temp 98.3°F | Ht 73.0 in | Wt 208.2 lb

## 2024-12-08 DIAGNOSIS — Z83719 Family history of colon polyps, unspecified: Secondary | ICD-10-CM | POA: Diagnosis not present

## 2024-12-08 DIAGNOSIS — Z130 Encounter for screening for diseases of the blood and blood-forming organs and certain disorders involving the immune mechanism: Secondary | ICD-10-CM | POA: Diagnosis not present

## 2024-12-08 DIAGNOSIS — Z131 Encounter for screening for diabetes mellitus: Secondary | ICD-10-CM | POA: Diagnosis not present

## 2024-12-08 DIAGNOSIS — Z13220 Encounter for screening for lipoid disorders: Secondary | ICD-10-CM | POA: Diagnosis not present

## 2024-12-08 DIAGNOSIS — E663 Overweight: Secondary | ICD-10-CM

## 2024-12-08 DIAGNOSIS — Z87891 Personal history of nicotine dependence: Secondary | ICD-10-CM

## 2024-12-08 DIAGNOSIS — Z1211 Encounter for screening for malignant neoplasm of colon: Secondary | ICD-10-CM | POA: Diagnosis not present

## 2024-12-08 DIAGNOSIS — Z0000 Encounter for general adult medical examination without abnormal findings: Secondary | ICD-10-CM

## 2024-12-08 NOTE — [facility] Notes (Signed)
 Phone: [phone]    Subjective:  Patient presents today for their annual physical. Chief complaint-noted.   See problem oriented charting- ROS- full  review of systems was completed and negative  except for topics noted under acute/chronic concerns  The following were reviewed and entered/updated in epic: Past Medical History:  Diagnosis Date   Alcohol abuse    Anxiety    Patient Active Problem List   Diagnosis Date Noted   History of alcohol use     Priority: Low   Family history of colonic polyps 01/26/2017   Past Surgical History:  Procedure Laterality Date   ANTERIOR CRUCIATE LIGAMENT REPAIR Right 01/27/2011    Family History  Problem Relation Age of Onset   Alcohol abuse Mother    Drug abuse Mother    Lung cancer Maternal Grandfather    Other Father        chronic back pain   Obesity Father    Colon polyps Father        not sure of age this started   Sudden death Cousin        before age 59 PNA leading to sepsis in cousin- tough for patient    Medications- reviewed and updated Current Outpatient Medications  Medication Sig Dispense Refill   Melatonin 5 MG CAPS Take by mouth. Every other night     milk thistle 175 MG tablet Take 175 mg by mouth daily.     Multiple Vitamin (MULTIVITAMIN) tablet Take 1 tablet by mouth daily.     No current facility-administered medications for this visit.    Allergies-reviewed and updated Allergies[1]  Social History   Social History Narrative   Married. Wife Ayla patient of Dr. Katrinka. 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 Oropharynx normal Neck: no thyromegaly CV: RRR no murmurs rubs or gallops Lungs: CTAB no crackles, wheeze, rhonchi Abdomen: soft/nontender/nondistended/normal bowel sounds. No rebound or guarding.  Ext: no edema Skin: warm, dry Neuro: grossly normal, moves all extremities, PERRLA     Assessment and Plan:  38 y.o. male presenting for annual physical.  Health Maintenance counseling: 1. Anticipatory guidance: Patient counseled regarding regular dental exams - still needs to set up, eye exams - through department of transportation ([employer] - plans to see eye doctor eventually as right eye slightly weaker,  avoiding smoking and second hand smoke, limiting alcohol to 2 beverages per day- 9 a week still, no illicit drugs.   2. Risk factor reduction:  Advised patient of need for regular exercise and diet rich and fruits and vegetables to reduce risk of heart attack and stroke.  Exercise- 4 days a week plus very active with work.  Diet/weight management-BMI overestimates his risk with good muscle mass reasonably healthy weight. 200 on home scales and he's right at goal Wt Readings from Last 3 Encounters:  12/08/24 208 lb 3.2 oz (94.4 kg)  12/08/23 211 lb 12.8  oz (96.1 kg)  12/05/22 205 lb (93 kg)  3. Immunizations/screenings/ancillary studies-opts out of COVID and flu shots  Immunization History  Administered Date(s) Administered   Hep B, Unspecified 10/28/1996, 12/02/1996, 03/03/1997   PFIZER(Purple Top)SARS-COV-2 Vaccination 04/20/2020, 05/11/2020   Tdap 01/26/2017, 06/26/2022  4. Prostate cancer screening-  no family history, start at age 39   5. Colon cancer screening - had seen GI around 2019 Dr. Legrand related to small cut that had healed.  Patient's father did have colon polyps right around 50  6. Skin cancer screening/prevention- sees dermatology yearly- good checkup. advised regular sunscreen use. Denies worrisome, changing, or new skin lesions.  7. Testicular  cancer screening- advised monthly self exams - no new areas- one small area prior ultrasound 8. STD screening- patient opts out-only active with wife 9. Smoking associated screening-former smoker- over 2 years cigarette free.  Get urinalysis.  Not a candidate for lung cancer screening. Very sparing dip but advised against  Status of chronic or acute concerns   # Penile irritation-saw urology in early 2025-essentially said likely related to his harness at work-did not see any visible lesions -Also previous 5 mm or less lesion which showed 3 mm tunica cyst on the left  # Screening labs as below   #mild hearing loss in left ear per department of transportation ([employer] physical- not bad enough for correction  Recommended follow up: Return in about 1 year (around 12/08/2025) for physical or sooner if needed.Schedule b4 you leave.  Lab/Order associations: fasting-labs will go to [facility]   ICD-10-CM   1. Preventative health care  Z00.00     2. Former smoker  Z87.891     3. Screening for hyperlipidemia  Z13.220     4. Screening, iron deficiency anemia  Z13.0     5. Screening for diabetes mellitus  Z13.1     6. Overweight  E66.3     7. Screen for colon cancer  Z12.11     8. Family history of polyps in the colon  Z83.719       No orders of the defined types were placed in this encounter.   Return precautions advised.   Garnette Lukes, MD      [1]  Allergies Allergen Reactions   Penicillins Anaphylaxis

## 2024-12-08 NOTE — Patient Instructions (Addendum)
 Please stop by lab before you go- I've requested all 4 labs go to [facility] If you have mychart- we will send your results within 3 business days of us  receiving them.  If you do not have mychart- we will call you about results within 5 business days of us  receiving them.  *please also note that you will see labs on mychart as soon as they post. I will later go in and write notes on them- will say notes from Dr. Katrinka Finn GI contact Please call to schedule visit and/or procedure IF you do not hear within a week Address: [address] [place], [place], [place] [address] Phone: [phone]   Try to find new dentist- very important for long term health  Recommended follow up: Return in about 1 year (around 12/08/2025) for physical or sooner if needed.Schedule b4 you leave.

## 2024-12-09 ENCOUNTER — Ambulatory Visit: Payer: Self-pay | Admitting: Family Medicine

## 2024-12-09 LAB — CBC WITH DIFFERENTIAL/PLATELET
Basophils Absolute: 0 x10E3/uL (ref 0.0–0.2)
Basos: 0 %
EOS (ABSOLUTE): 0.2 x10E3/uL (ref 0.0–0.4)
Eos: 3 %
Hematocrit: 47.5 % (ref 37.5–51.0)
Hemoglobin: 15.9 g/dL (ref 13.0–17.7)
Immature Grans (Abs): 0 x10E3/uL (ref 0.0–0.1)
Immature Granulocytes: 0 %
Lymphocytes Absolute: 1.7 x10E3/uL (ref 0.7–3.1)
Lymphs: 29 %
MCH: 32.5 pg (ref 26.6–33.0)
MCHC: 33.5 g/dL (ref 31.5–35.7)
MCV: 97 fL (ref 79–97)
Monocytes Absolute: 0.4 x10E3/uL (ref 0.1–0.9)
Monocytes: 7 %
Neutrophils Absolute: 3.5 x10E3/uL (ref 1.4–7.0)
Neutrophils: 61 %
Platelets: 223 x10E3/uL (ref 150–450)
RBC: 4.89 x10E6/uL (ref 4.14–5.80)
RDW: 12.4 % (ref 11.6–15.4)
WBC: 5.8 x10E3/uL (ref 3.4–10.8)

## 2024-12-09 LAB — COMPREHENSIVE METABOLIC PANEL WITH GFR
ALT: 16 IU/L (ref 0–44)
AST: 14 IU/L (ref 0–40)
Albumin: 4.6 g/dL (ref 4.1–5.1)
Alkaline Phosphatase: 65 IU/L (ref 47–123)
BUN/Creatinine Ratio: 14 (ref 9–20)
BUN: 11 mg/dL (ref 6–24)
Bilirubin Total: 0.7 mg/dL (ref 0.0–1.2)
CO2: 24 mmol/L (ref 20–29)
Calcium: 9.7 mg/dL (ref 8.7–10.2)
Chloride: 102 mmol/L (ref 96–106)
Creatinine, Ser: 0.8 mg/dL (ref 0.76–1.27)
Globulin, Total: 2.5 g/dL (ref 1.5–4.5)
Glucose: 85 mg/dL (ref 70–99)
Potassium: 4.1 mmol/L (ref 3.5–5.2)
Sodium: 139 mmol/L (ref 134–144)
Total Protein: 7.1 g/dL (ref 6.0–8.5)
eGFR: 115 mL/min/1.73 (ref >59)

## 2024-12-09 LAB — LIPID PANEL
Chol/HDL Ratio: 2.7 ratio (ref 0.0–5.0)
Cholesterol, Total: 168 mg/dL (ref 100–199)
HDL: 63 mg/dL (ref >39)
LDL Chol Calc (NIH): 94 mg/dL (ref 0–99)
Triglycerides: 56 mg/dL (ref 0–149)
VLDL Cholesterol Cal: 11 mg/dL (ref 5–40)

## 2024-12-09 LAB — URINALYSIS, ROUTINE W REFLEX MICROSCOPIC
Bilirubin, UA: NEGATIVE
Glucose, UA: NEGATIVE
Ketones, UA: NEGATIVE
Leukocytes,UA: NEGATIVE
Nitrite, UA: NEGATIVE
Protein,UA: NEGATIVE
RBC, UA: NEGATIVE
Specific Gravity, UA: 1.005 (ref 1.005–1.030)
Urobilinogen, Ur: 0.2 mg/dL (ref 0.2–1.0)
pH, UA: 7 (ref 5.0–7.5)

## 2024-12-09 LAB — HEMOGLOBIN A1C
Est. average glucose Bld gHb Est-mCnc: 105 mg/dL
Hgb A1c MFr Bld: 5.3 % (ref 4.8–5.6)

## 2025-01-04 ENCOUNTER — Encounter: Payer: Self-pay | Admitting: Physician Assistant

## 2025-01-16 ENCOUNTER — Telehealth: Payer: Self-pay | Admitting: Family Medicine

## 2025-01-16 NOTE — Telephone Encounter (Signed)
 Type of form received: Physician wellness screening results  Additional comments:   Received by: Therisa w  Form should be Faxed to:  Form should be mailed to:    Is patient requesting call for pickup: yes   Form placed:  PCP Inbox  Attach charge sheet. yes  Individual made aware of 3-5 business day turn around (Y/N)? yes

## 2025-01-16 NOTE — Telephone Encounter (Signed)
 Completed and ready for pickup

## 2025-01-30 ENCOUNTER — Ambulatory Visit: Admitting: Gastroenterology

## 2025-01-31 ENCOUNTER — Ambulatory Visit: Admitting: Physician Assistant

## 2025-02-02 NOTE — Progress Notes (Unsigned)
 "  Chief Complaint: Discuss colonoscopy  HPI:    Mr. Tuman is a 39 year old Caucasian male, known to Dr. Legrand, with a past medical history as listed below including alcohol abuse, who presents to clinic today to discuss a colonoscopy.    05/17/2018 office visit with Dr. Susy to discuss rectal bleeding.  At that time exam with hemorrhoids likely source.  Continue on daily stool softener.    12/08/2024 CBC, CMP, hemoglobin A1c normal.  Past Medical History:  Diagnosis Date   Alcohol abuse    Anxiety     Past Surgical History:  Procedure Laterality Date   ANTERIOR CRUCIATE LIGAMENT REPAIR Right 01/27/2011    Current Outpatient Medications  Medication Sig Dispense Refill   Melatonin 5 MG CAPS Take by mouth. Every other night     milk thistle 175 MG tablet Take 175 mg by mouth daily.     Multiple Vitamin (MULTIVITAMIN) tablet Take 1 tablet by mouth daily.     No current facility-administered medications for this visit.    Allergies as of 02/03/2025 - Review Complete 12/08/2024  Allergen Reaction Noted   Penicillins Anaphylaxis 12/17/2012    Family History  Problem Relation Age of Onset   Alcohol abuse Mother    Drug abuse Mother    Lung cancer Maternal Grandfather    Other Father        chronic back pain   Obesity Father    Colon polyps Father        not sure of age this started   Sudden death Cousin        before age 61 PNA leading to sepsis in cousin- tough for patient    Social History   Socioeconomic History   Marital status: Married    Spouse name: Not on file   Number of children: 0   Years of education: Not on file   Highest education level: 12th grade  Occupational History   Occupation: Psychologist, Occupational  Tobacco Use   Smoking status: Former    Current packs/day: 0.00    Average packs/day: 0.5 packs/day for 8.0 years (4.0 ttl pk-yrs)    Types: Cigarettes    Start date: 10/29/2014    Quit date: 10/29/2022    Years since quitting: 2.2   Smokeless tobacco: Never   Substance and Sexual Activity   Alcohol use: Yes    Alcohol/week: 3.0 standard drinks of alcohol    Types: 3 Cans of beer per week    Comment: 2 per day   Drug use: Never   Sexual activity: Yes    Birth control/protection: None  Other Topics Concern   Not on file  Social History Narrative   Married. Wife Ayla patient of Dr. Katrinka. 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 degree      Hobbies: shoot, hunt, prior running but tore acl      Social Drivers of Health   Tobacco Use: Medium Risk (12/08/2024)   Patient History    Smoking Tobacco Use: Former    Smokeless Tobacco Use: Never    Passive Exposure: Not on file  Financial Resource Strain: Low Risk (12/08/2024)   Overall Financial Resource Strain (CARDIA)    Difficulty of Paying Living Expenses: Not very hard  Food Insecurity: No Food Insecurity (12/08/2024)   Epic    Worried About Radiation Protection Practitioner of Food in the Last Year: Never  true    Ran Out of Food in the Last Year: Never true  Transportation Needs: No Transportation Needs (12/08/2024)   Epic    Lack of Transportation (Medical): No    Lack of Transportation (Non-Medical): No  Physical Activity: Sufficiently Active (12/08/2024)   Exercise Vital Sign    Days of Exercise per Week: 5 days    Minutes of Exercise per Session: 150+ min  Stress: Patient Declined (12/08/2024)   [employer] of Occupational Health - Occupational Stress Questionnaire    Feeling of Stress: Patient declined  Social Connections: Socially Isolated (12/08/2024)   Social Connection and Isolation Panel    Frequency of Communication with Friends and Family: Once a week    Frequency of Social Gatherings with Friends and Family: Once a week    Attends Religious Services: Never    Database Administrator or Organizations: No    Attends Engineer, Structural: Not on file    Marital Status: Married  Catering Manager Violence:  Not on file  Depression (PHQ2-9): Low Risk (12/08/2024)   Depression (PHQ2-9)    PHQ-2 Score: 0  Alcohol Screen: Low Risk (12/08/2024)   Alcohol Screen    Last Alcohol Screening Score (AUDIT): 4  Housing: Unknown (12/08/2024)   Epic    Unable to Pay for Housing in the Last Year: No    Number of Times Moved in the Last Year: Not on file    Homeless in the Last Year: No  Utilities: Not on file  Health Literacy: Not on file    Review of Systems:    Constitutional: No weight loss, fever, chills, weakness or fatigue HEENT: Eyes: No change in vision               Ears, Nose, Throat:  No change in hearing or congestion Skin: No rash or itching Cardiovascular: No chest pain, chest pressure or palpitations   Respiratory: No SOB or cough Gastrointestinal: See HPI and otherwise negative Genitourinary: No dysuria or change in urinary frequency Neurological: No headache, dizziness or syncope Musculoskeletal: No new muscle or joint pain Hematologic: No bleeding or bruising Psychiatric: No history of depression or anxiety    Physical Exam:  Vital signs: There were no vitals taken for this visit.  Constitutional:   Pleasant Caucasian male appears to be in NAD, Well developed, Well nourished, alert and cooperative Head:  Normocephalic and atraumatic. Eyes:   PEERL, EOMI. No icterus. Conjunctiva pink. Ears:  Normal auditory acuity. Neck:  Supple Throat: Oral cavity and pharynx without inflammation, swelling or lesion.  Respiratory: Respirations even and unlabored. Lungs clear to auscultation bilaterally.   No wheezes, crackles, or rhonchi.  Cardiovascular: Normal S1, S2. No MRG. Regular rate and rhythm. No peripheral edema, cyanosis or pallor.  Gastrointestinal:  Soft, nondistended, nontender. No rebound or guarding. Normal bowel sounds. No appreciable masses or hepatomegaly. Rectal:  Not performed.  Msk:  Symmetrical without gross deformities. Without edema, no deformity or joint  abnormality.  Neurologic:  Alert and  oriented x4;  grossly normal neurologically.  Skin:   Dry and intact without significant lesions or rashes. Psychiatric: Oriented to person, place and time. Demonstrates good judgement and reason without abnormal affect or behaviors.  RELEVANT LABS AND IMAGING: CBC    Component Value Date/Time   WBC 5.8 12/08/2024 0824   WBC 6.7 12/05/2022 1052   RBC 4.89 12/08/2024 0824   RBC 4.99 12/05/2022 1052   HGB 15.9 12/08/2024 0824   HCT 47.5 12/08/2024  0824   PLT 223 12/08/2024 0824   MCV 97 12/08/2024 0824   MCV 95 04/15/2018 0000   MCH 32.5 12/08/2024 0824   MCH 32.0 04/15/2018 0000   MCHC 33.5 12/08/2024 0824   MCHC 34.0 12/05/2022 1052   RDW 12.4 12/08/2024 0824   RDW 13.1 04/15/2018 0000   LYMPHSABS 1.7 12/08/2024 0824   MONOABS 0.6 12/05/2022 1052   EOSABS 0.2 12/08/2024 0824   BASOSABS 0.0 12/08/2024 0824    CMP     Component Value Date/Time   NA 139 12/08/2024 0824   K 4.1 12/08/2024 0824   CL 102 12/08/2024 0824   CO2 24 12/08/2024 0824   GLUCOSE 85 12/08/2024 0824   GLUCOSE 86 12/05/2022 1052   BUN 11 12/08/2024 0824   CREATININE 0.80 12/08/2024 0824   CALCIUM 9.7 12/08/2024 0824   PROT 7.1 12/08/2024 0824   ALBUMIN 4.6 12/08/2024 0824   AST 14 12/08/2024 0824   ALT 16 12/08/2024 0824   ALKPHOS 65 12/08/2024 0824   BILITOT 0.7 12/08/2024 0824   GFRNONAA 114 04/27/2018 1503   GFRAA 132 04/27/2018 1503    Assessment: 1. ***  Plan: 1. ***     Delon Failing, PA-C  Gastroenterology 02/02/2025, 11:40 AM  Cc: Katrinka Garnette KIDD, MD  "

## 2025-02-03 ENCOUNTER — Encounter: Payer: Self-pay | Admitting: Physician Assistant

## 2025-02-03 ENCOUNTER — Ambulatory Visit: Admitting: Physician Assistant

## 2025-02-03 VITALS — BP 126/72 | HR 63 | Ht 73.0 in | Wt 214.4 lb

## 2025-02-03 DIAGNOSIS — Z09 Encounter for follow-up examination after completed treatment for conditions other than malignant neoplasm: Secondary | ICD-10-CM

## 2025-02-03 DIAGNOSIS — Z83719 Family history of colon polyps, unspecified: Secondary | ICD-10-CM

## 2025-02-03 MED ORDER — NA SULFATE-K SULFATE-MG SULF 17.5-3.13-1.6 GM/177ML PO SOLN: 1.0000 | Freq: Once | ORAL | 0 refills | Status: AC

## 2025-02-03 NOTE — Patient Instructions (Signed)

## 2025-03-23 ENCOUNTER — Encounter: Admitting: Gastroenterology

## 2025-12-11 ENCOUNTER — Encounter: Admitting: Family Medicine
